# Patient Record
Sex: Female | Born: 1955 | Race: White | Hispanic: No | Marital: Married | Smoking: Former smoker
Health system: Southern US, Community
[De-identification: ages and names within clinical notes are randomized; demographics above are authoritative.]

## PROBLEM LIST (undated history)

## (undated) DIAGNOSIS — Z8489 Family history of other specified conditions: Secondary | ICD-10-CM

## (undated) DIAGNOSIS — F419 Anxiety disorder, unspecified: Secondary | ICD-10-CM

## (undated) DIAGNOSIS — K219 Gastro-esophageal reflux disease without esophagitis: Secondary | ICD-10-CM

## (undated) DIAGNOSIS — M1990 Unspecified osteoarthritis, unspecified site: Secondary | ICD-10-CM

## (undated) HISTORY — PX: BREAST BIOPSY: SHX20

## (undated) HISTORY — PX: BREAST SURGERY: SHX581

## (undated) HISTORY — PX: JOINT REPLACEMENT: SHX530

---

## 1994-05-11 HISTORY — PX: CYST EXCISION: SHX5701

## 2000-05-11 HISTORY — PX: PLANTAR FASCIA SURGERY: SHX746

## 2004-05-26 ENCOUNTER — Ambulatory Visit: Payer: Self-pay | Admitting: Unknown Physician Specialty

## 2005-08-31 ENCOUNTER — Ambulatory Visit: Payer: Self-pay | Admitting: Unknown Physician Specialty

## 2007-04-19 ENCOUNTER — Ambulatory Visit: Payer: Self-pay | Admitting: Unknown Physician Specialty

## 2008-11-05 ENCOUNTER — Ambulatory Visit: Payer: Self-pay | Admitting: Family Medicine

## 2009-04-23 ENCOUNTER — Ambulatory Visit: Payer: Self-pay | Admitting: Unknown Physician Specialty

## 2010-05-08 ENCOUNTER — Ambulatory Visit: Payer: Self-pay | Admitting: Unknown Physician Specialty

## 2011-01-23 ENCOUNTER — Ambulatory Visit: Payer: Self-pay | Admitting: Internal Medicine

## 2011-07-14 ENCOUNTER — Ambulatory Visit: Payer: Self-pay | Admitting: Unknown Physician Specialty

## 2015-05-01 ENCOUNTER — Ambulatory Visit
Admission: EM | Admit: 2015-05-01 | Discharge: 2015-05-01 | Disposition: A | Discharge status: Home / Self Care | Payer: BLUE CROSS/BLUE SHIELD | Attending: Family Medicine | Admitting: Family Medicine

## 2015-05-01 ENCOUNTER — Encounter: Payer: Self-pay | Admitting: *Deleted

## 2015-05-01 DIAGNOSIS — R350 Frequency of micturition: Secondary | ICD-10-CM | POA: Diagnosis not present

## 2015-05-01 DIAGNOSIS — R300 Dysuria: Secondary | ICD-10-CM

## 2015-05-01 DIAGNOSIS — J029 Acute pharyngitis, unspecified: Secondary | ICD-10-CM | POA: Diagnosis not present

## 2015-05-01 DIAGNOSIS — IMO0001 Reserved for inherently not codable concepts without codable children: Secondary | ICD-10-CM

## 2015-05-01 DIAGNOSIS — R109 Unspecified abdominal pain: Secondary | ICD-10-CM | POA: Diagnosis not present

## 2015-05-01 LAB — URINALYSIS COMPLETE WITH MICROSCOPIC (ARMC ONLY)
BACTERIA UA: NONE SEEN
Bilirubin Urine: NEGATIVE
Glucose, UA: NEGATIVE mg/dL
Hgb urine dipstick: NEGATIVE
Ketones, ur: NEGATIVE mg/dL
LEUKOCYTES UA: NEGATIVE
Nitrite: NEGATIVE
PH: 7 (ref 5.0–8.0)
PROTEIN: NEGATIVE mg/dL
RBC / HPF: NONE SEEN RBC/hpf (ref 0–5)
Specific Gravity, Urine: 1.015 (ref 1.005–1.030)
WBC, UA: NONE SEEN WBC/hpf (ref 0–5)

## 2015-05-01 LAB — RAPID STREP SCREEN (MED CTR MEBANE ONLY): Streptococcus, Group A Screen (Direct): NEGATIVE

## 2015-05-01 MED ORDER — FEXOFENADINE-PSEUDOEPHED ER 180-240 MG PO TB24: 1.0000 | ORAL_TABLET | Freq: Every day | ORAL | Status: DC

## 2015-05-01 MED ORDER — PHENAZOPYRIDINE HCL 200 MG PO TABS: 200.0000 mg | ORAL_TABLET | Freq: Three times a day (TID) | ORAL | Status: DC | PRN

## 2015-05-01 NOTE — ED Provider Notes (Signed)
CSN: [id number]     Arrival date & time 05/01/15  1138 History   First MD Initiated Contact with Patient 05/01/15 1327    Nurses notes were reviewed. Chief Complaint  Patient presents with  . Urinary Tract Infection  . Sore Throat   Patient is here with a mixed group of symptoms. She reports having some lower abdominal pain for about 3 weeks. She reports going to the bathroom more and having burning when she urinates. She states for the last 3-4 days she's also had a sore throat as well. Patient is somewhat vague as far as how intense the symptoms are. She denies any dysuria dyspareunia besides her usual dyspareunia and any vaginal discharge. When asked about fever she states she still gets warm and cold but no actual temperature was taken or known.  (Consider location/radiation/quality/duration/timing/severity/associated sxs/prior Treatment) Patient is a 59 y.o. female presenting with urinary tract infection and pharyngitis. The history is provided by the patient. No language interpreter was used.  Urinary Tract Infection Pain quality:  Burning Pain severity:  Mild Duration:  3 weeks Timing:  Intermittent Progression:  Waxing and waning Chronicity:  New Relieved by:  Nothing Ineffective treatments:  None tried Urinary symptoms: frequent urination   Urinary symptoms: no hesitancy and no bladder incontinence   Associated symptoms: abdominal pain   Associated symptoms: no fever, no flank pain, no nausea, no vaginal discharge and no vomiting   Risk factors: sexually active   Risk factors: no hx of pyelonephritis, no recurrent urinary tract infections and no sexually transmitted infections   Sore Throat This is a new problem. The current episode started more than 2 days ago. The problem occurs constantly. The problem has not changed since onset.Associated symptoms include abdominal pain. Pertinent negatives include no chest pain and no shortness of breath. Nothing aggravates the symptoms.  Nothing relieves the symptoms. She has tried nothing for the symptoms.    History reviewed. No pertinent past medical history. Past Surgical History  Procedure Laterality Date  . Breast surgery      right  . Cyst excision Left     arm  . Plantar fascia surgery Left    History reviewed. No pertinent family history. Social History  Substance Use Topics  . Smoking status: Never Smoker   . Smokeless tobacco: Never Used  . Alcohol Use: No   OB History    No data available     Review of Systems  Constitutional: Negative for fever.  Respiratory: Negative for shortness of breath.   Cardiovascular: Negative for chest pain.  Gastrointestinal: Positive for abdominal pain. Negative for nausea and vomiting.  Genitourinary: Negative for flank pain and vaginal discharge.  All other systems reviewed and are negative.   Allergies  Penicillins and Sulfa antibiotics  Home Medications   Prior to Admission medications   Not on File   Meds Ordered and Administered this Visit  Medications - No data to display  BP 142/79 mmHg  Pulse 87  Temp(Src) 97.8 F (36.6 C) (Oral)  Resp 18  Ht  (1.575 m)  Wt 150 lb (68.04 kg)  BMI 27.43 kg/m2  SpO2 97% No data found.   Physical Exam  Constitutional: She is oriented to person, place, and time. She appears well-developed.  HENT:  Head: Normocephalic and atraumatic.  Right Ear: External ear normal.  Left Ear: External ear normal.  Nose: Nose normal.  Mouth/Throat: Oropharynx is clear and moist.  Eyes: Pupils are equal, round, and reactive to  light.  Neck: Neck supple. No tracheal deviation present.  Abdominal: Bowel sounds are normal. There is no tenderness.  Musculoskeletal: Normal range of motion. She exhibits no tenderness.  Neurological: She is alert and oriented to person, place, and time. No cranial nerve deficit.  Skin: Skin is warm. No rash noted. No erythema.  Psychiatric: She has a normal mood and affect.  Vitals  reviewed.   ED Course  Procedures (including critical care time)  Labs Review Labs Reviewed  URINALYSIS COMPLETEWITH MICROSCOPIC ([facility] ONLY) - Abnormal; Notable for the following:    Color, Urine YELLOW (*)    APPearance CLEAR (*)    Squamous Epithelial / LPF 0-5 (*)    All other components within normal limits  RAPID STREP SCREEN (NOT [facility])  URINE CULTURE    Imaging Review No results found.   Visual Acuity Review  Right Eye Distance:   Left Eye Distance:   Bilateral Distance:    Right Eye Near:   Left Eye Near:    Bilateral Near:      Results for orders placed or performed during the hospital encounter of 05/01/15  Rapid strep screen  Result Value Ref Range   Streptococcus, Group A Screen (Direct) NEGATIVE NEGATIVE  Urinalysis complete, with microscopic  Result Value Ref Range   Color, Urine YELLOW (A) YELLOW   APPearance CLEAR (A) CLEAR   Glucose, UA NEGATIVE NEGATIVE mg/dL   Bilirubin Urine NEGATIVE NEGATIVE   Ketones, ur NEGATIVE NEGATIVE mg/dL   Specific Gravity, Urine 1.015 1.005 - 1.030   Hgb urine dipstick NEGATIVE NEGATIVE   pH 7.0 5.0 - 8.0   Protein, ur NEGATIVE NEGATIVE mg/dL   Nitrite NEGATIVE NEGATIVE   Leukocytes, UA NEGATIVE NEGATIVE   RBC / HPF NONE SEEN 0 - 5 RBC/hpf   WBC, UA NONE SEEN 0 - 5 WBC/hpf   Bacteria, UA NONE SEEN NONE SEEN   Squamous Epithelial / LPF 0-5 (A) NONE SEEN     MDM   1. Abdominal discomfort   2. Dysuria   3. Frequency   4. Pharyngitis     Were going to lay some Pyridium 1 tablet 3 times a day. Allegra-D daily. Will wait and see what the urine culture and strep test culture show before placing on an antibiotic. Work note given for today and tomorrow. Will treat if strep culture or urine culture comes back abnormal.       Hassan RowanEugene Chistina Roston, MD 05/01/15 1416

## 2015-05-01 NOTE — ED Notes (Signed)
Patient has been having symptoms lower abdominal pain and burning during urination for one month. Additional symptom of sore throat started 2 days ago. Nasal congestion that is grayish in color. Patient states that she feels feverish.

## 2015-05-01 NOTE — Discharge Instructions (Signed)
Abdominal Pain, Adult Many things can cause belly (abdominal) pain. Most times, the belly pain is not dangerous. Many cases of belly pain can be watched and treated at home. HOME CARE   Do not take medicines that help you go poop (laxatives) unless told to by your doctor.  Only take medicine as told by your doctor.  Eat or drink as told by your doctor. Your doctor will tell you if you should be on a special diet. GET HELP IF:  You do not know what is causing your belly pain.  You have belly pain while you are sick to your stomach (nauseous) or have runny poop (diarrhea).  You have pain while you pee or poop.  Your belly pain wakes you up at night.  You have belly pain that gets worse or better when you eat.  You have belly pain that gets worse when you eat fatty foods.  You have a fever. GET HELP RIGHT AWAY IF:   The pain does not go away within 2 hours.  You keep throwing up (vomiting).  The pain changes and is only in the right or left part of the belly.  You have bloody or tarry looking poop. MAKE SURE YOU:   Understand these instructions.  Will watch your condition.  Will get help right away if you are not doing well or get worse.   This information is not intended to replace advice given to you by your health care provider. Make sure you discuss any questions you have with your health care provider.   Document Released: 10/14/2007 Document Revised: 05/18/2014 Document Reviewed: 01/04/2013 Elsevier Interactive Patient Education 2016 [employer]  Pharyngitis Pharyngitis is a sore throat (pharynx). There is redness, pain, and swelling of your throat. HOME CARE   Drink enough fluids to keep your pee (urine) clear or pale yellow.  Only take medicine as told by your doctor.  You may get sick again if you do not take medicine as told. Finish your medicines, even if you start to feel better.  Do not take aspirin.  Rest.  Rinse your mouth (gargle) with salt  water ( tsp of salt per 1 qt of water) every 1-2 hours. This will help the pain.  If you are not at risk for choking, you can suck on hard candy or sore throat lozenges. GET HELP IF:  You have large, tender lumps on your neck.  You have a rash.  You cough up green, yellow-brown, or bloody spit. GET HELP RIGHT AWAY IF:   You have a stiff neck.  You drool or cannot swallow liquids.  You throw up (vomit) or are not able to keep medicine or liquids down.  You have very bad pain that does not go away with medicine.  You have problems breathing (not from a stuffy nose). MAKE SURE YOU:   Understand these instructions.  Will watch your condition.  Will get help right away if you are not doing well or get worse.   This information is not intended to replace advice given to you by your health care provider. Make sure you discuss any questions you have with your health care provider.   Document Released: 10/14/2007 Document Revised: 02/15/2013 Document Reviewed: 01/02/2013 Elsevier Interactive Patient Education 2016 [employer].  Urinary Frequency The number of times a normal person urinates depends upon how much liquid they take in and how much liquid they are losing. If the temperature is hot and there is high humidity, then  the person will sweat more and usually breathe a little more frequently. These factors decrease the amount of frequency of urination that would be considered normal. The amount you drink is easily determined, but the amount of fluid lost is sometimes more difficult to calculate.  Fluid is lost in two ways:  Sensible fluid loss is usually measured by the amount of urine that you get rid of. Losses of fluid can also occur with diarrhea.  Insensible fluid loss is more difficult to measure. It is caused by evaporation. Insensible loss of fluid occurs through breathing and sweating. It usually ranges from a little less than a quart to a little more than a quart of  fluid a day. In normal temperatures and activity levels, the average person may urinate 4 to 7 times in a 24-hour period. Needing to urinate more often than that could indicate a problem. If one urinates 4 to 7 times in 24 hours and has large volumes each time, that could indicate a different problem from one who urinates 4 to 7 times a day and has small volumes. The time of urinating is also important. Most urinating should be done during the waking hours. Getting up at night to urinate frequently can indicate some problems. CAUSES  The bladder is the organ in your lower abdomen that holds urine. Like a balloon, it swells some as it fills up. Your nerves sense this and tell you it is time to head for the bathroom. There are a number of reasons that you might feel the need to urinate more often than usual. They include:  Urinary tract infection. This is usually associated with other signs such as burning when you urinate.  In men, problems with the prostate (a walnut-size gland that is located near the tube that carries urine out of your body). There are two reasons why the prostate can cause an increased frequency of urination:  An enlarged prostate that does not let the bladder empty well. If the bladder only half empties when you urinate, then it only has half the capacity to fill before you have to urinate again.  The nerves in the bladder become more hypersensitive with an increased size of the prostate even if the bladder empties completely.  Pregnancy.  Obesity. Excess weight is more likely to cause a problem for women than for men.  Bladder stones or other bladder problems.  Caffeine.  Alcohol.  Medications. For example, drugs that help the body get rid of extra fluid (diuretics) increase urine production. Some other medicines must be taken with lots of fluids.  Muscle or nerve weakness. This might be the result of a spinal cord injury, a stroke, multiple sclerosis, or Parkinson  disease.  Long-standing diabetes can decrease the sensation of the bladder. This loss of sensation makes it harder to sense the bladder needs to be emptied. Over a period of years, the bladder is stretched out by constant overfilling. This weakens the bladder muscles so that the bladder does not empty well and has less capacity to fill with new urine.  Interstitial cystitis (also called painful bladder syndrome). This condition develops because the tissues that line the inside of the bladder are inflamed (inflammation is the body's way of reacting to injury or infection). It causes pain and frequent urination. It occurs in women more often than in men. DIAGNOSIS   To decide what might be causing your urinary frequency, your health care provider will probably:  Ask about symptoms you have noticed.  Ask about your overall health. This will include questions about any medications you are taking.  Do a physical examination.  Order some tests. These might include:  A blood test to check for diabetes or other health issues that could be contributing to the problem.  Urine testing. This could measure the flow of urine and the pressure on the bladder.  A test of your neurological system (the brain, spinal cord, and nerves). This is the system that senses the need to urinate.  A bladder test to check whether it is emptying completely when you urinate.  Cystoscopy. This test uses a thin tube with a tiny camera on it. It offers a look inside your urethra and bladder to see if there are problems.  Imaging tests. You might be given a contrast dye and then asked to urinate. X-rays are taken to see how your bladder is working. TREATMENT  It is important for you to be evaluated to determine if the amount or frequency that you have is unusual or abnormal. If it is found to be abnormal, the cause should be determined and this can usually be found out easily. Depending upon the cause, treatment could  include medication, stimulation of the nerves, or surgery. There are not too many things that you can do as an individual to change your urinary frequency. It is important that you balance the amount of fluid intake needed to compensate for your activity and the temperature. Medical problems will be diagnosed and taken care of by your physician. There is no particular bladder training such as Kegel exercises that you can do to help urinary frequency. This is an exercise that is usually recommended for people who have leaking of urine when they laugh, cough, or sneeze. HOME CARE INSTRUCTIONS   Take any medications your health care provider prescribed or suggested. Follow the directions carefully.  Practice any lifestyle changes that are recommended. These might include:  Drinking less fluid or drinking at different times of the day. If you need to urinate often during the night, for example, you may need to stop drinking fluids early in the evening.  Cutting down on caffeine or alcohol. They both can make you need to urinate more often than normal. Caffeine is found in coffee, tea, and sodas.  Losing weight, if that is recommended.  Keep a journal or a log. You might be asked to record how much you drink and when and where you feel the need to urinate. This will also help evaluate how well the treatment provided by your physician is working. SEEK MEDICAL CARE IF:   Your need to urinate often gets worse.  You feel increased pain or irritation when you urinate.  You notice blood in your urine.  You have questions about any medications that your health care provider recommended.  You notice blood, pus, or swelling at the site of any test or treatment procedure.  You develop a fever of more than 100.43F (38.1C). SEEK IMMEDIATE MEDICAL CARE IF:  You develop a fever of more than 102.58F (38.9C).   This information is not intended to replace advice given to you by your health care provider.  Make sure you discuss any questions you have with your health care provider.   Document Released: 02/21/2009 Document Revised: 05/18/2014 Document Reviewed: 02/21/2009 Elsevier Interactive Patient Education 2016 [employer] Inc.  Dysuria Dysuria is pain or discomfort while urinating. The pain or discomfort may be felt in the tube that carries urine out of the  bladder (urethra) or in the surrounding tissue of the genitals. The pain may also be felt in the groin area, lower abdomen, and lower back. You may have to urinate frequently or have the sudden feeling that you have to urinate (urgency). Dysuria can affect both men and women, but is more common in women. Dysuria can be caused by many different things, including:  Urinary tract infection in women.  Infection of the kidney or bladder.  Kidney stones or bladder stones.  Certain sexually transmitted infections (STIs), such as chlamydia.  Dehydration.  Inflammation of the vagina.  Use of certain medicines.  Use of certain soaps or scented products that cause irritation. HOME CARE INSTRUCTIONS Watch your dysuria for any changes. The following actions may help to reduce any discomfort you are feeling:  Drink enough fluid to keep your urine clear or pale yellow.  Empty your bladder often. Avoid holding urine for long periods of time.  After a bowel movement or urination, women should cleanse from front to back, using each tissue only once.  Empty your bladder after sexual intercourse.  Take medicines only as directed by your health care provider.  If you were prescribed an antibiotic medicine, finish it all even if you start to feel better.  Avoid caffeine, tea, and alcohol. They can irritate the bladder and make dysuria worse. In men, alcohol may irritate the prostate.  Keep all follow-up visits as directed by your health care provider. This is important.  If you had any tests done to find the cause of dysuria, it is your  responsibility to obtain your test results. Ask the lab or department performing the test when and how you will get your results. Talk with your health care provider if you have any questions about your results. SEEK MEDICAL CARE IF:  You develop pain in your back or sides.  You have a fever.  You have nausea or vomiting.  You have blood in your urine.  You are not urinating as often as you usually do. SEEK IMMEDIATE MEDICAL CARE IF:  You pain is severe and not relieved with medicines.  You are unable to hold down any fluids.  You or someone else notices a change in your mental function.  You have a rapid heartbeat at rest.  You have shaking or chills.  You feel extremely weak.   This information is not intended to replace advice given to you by your health care provider. Make sure you discuss any questions you have with your health care provider.   Document Released: 01/24/2004 Document Revised: 05/18/2014 Document Reviewed: 12/21/2013 Elsevier Interactive Patient Education [employer]

## 2015-05-03 LAB — URINE CULTURE: Special Requests: NORMAL

## 2015-05-03 LAB — CULTURE, GROUP A STREP (THRC)

## 2015-06-05 ENCOUNTER — Other Ambulatory Visit: Payer: Self-pay | Admitting: Nurse Practitioner

## 2015-06-05 DIAGNOSIS — Z87828 Personal history of other (healed) physical injury and trauma: Secondary | ICD-10-CM

## 2015-06-12 ENCOUNTER — Ambulatory Visit
Admission: RE | Admit: 2015-06-12 | Discharge: 2015-06-12 | Disposition: A | Discharge status: Home / Self Care | Payer: BLUE CROSS/BLUE SHIELD | Source: Ambulatory Visit | Attending: Nurse Practitioner | Admitting: Nurse Practitioner

## 2015-06-12 DIAGNOSIS — R51 Headache: Secondary | ICD-10-CM | POA: Diagnosis not present

## 2015-06-12 DIAGNOSIS — H5711 Ocular pain, right eye: Secondary | ICD-10-CM | POA: Insufficient documentation

## 2015-06-12 DIAGNOSIS — Z87828 Personal history of other (healed) physical injury and trauma: Secondary | ICD-10-CM

## 2015-12-23 ENCOUNTER — Other Ambulatory Visit: Payer: Self-pay | Admitting: Nurse Practitioner

## 2015-12-23 DIAGNOSIS — Z1231 Encounter for screening mammogram for malignant neoplasm of breast: Secondary | ICD-10-CM

## 2015-12-31 ENCOUNTER — Ambulatory Visit
Admission: RE | Admit: 2015-12-31 | Discharge: 2015-12-31 | Disposition: A | Discharge status: Home / Self Care | Payer: BLUE CROSS/BLUE SHIELD | Source: Ambulatory Visit | Attending: Nurse Practitioner | Admitting: Nurse Practitioner

## 2015-12-31 ENCOUNTER — Other Ambulatory Visit: Payer: Self-pay | Admitting: Nurse Practitioner

## 2015-12-31 DIAGNOSIS — Z1231 Encounter for screening mammogram for malignant neoplasm of breast: Secondary | ICD-10-CM

## 2016-09-23 ENCOUNTER — Encounter: Payer: Self-pay | Admitting: *Deleted

## 2016-09-23 ENCOUNTER — Ambulatory Visit
Admission: EM | Admit: 2016-09-23 | Discharge: 2016-09-23 | Disposition: A | Discharge status: Home / Self Care | Payer: BLUE CROSS/BLUE SHIELD | Attending: Family Medicine | Admitting: Family Medicine

## 2016-09-23 DIAGNOSIS — J309 Allergic rhinitis, unspecified: Secondary | ICD-10-CM | POA: Diagnosis not present

## 2016-09-23 DIAGNOSIS — H6692 Otitis media, unspecified, left ear: Secondary | ICD-10-CM | POA: Diagnosis not present

## 2016-09-23 DIAGNOSIS — J029 Acute pharyngitis, unspecified: Secondary | ICD-10-CM

## 2016-09-23 LAB — RAPID STREP SCREEN (MED CTR MEBANE ONLY): STREPTOCOCCUS, GROUP A SCREEN (DIRECT): NEGATIVE

## 2016-09-23 MED ORDER — AZITHROMYCIN 250 MG PO TABS: ORAL_TABLET | ORAL | 0 refills | Status: DC

## 2016-09-23 NOTE — ED Provider Notes (Signed)
[facility] ____________________________________________  Time seen: Approximately 5:34 PM  I have reviewed the triage vital signs and the nursing notes.  HISTORY  Chief Complaint Sore Throat; Nasal Congestion; and Otalgia  HPI Jillian Nelson is a 61 y.o. female presenting for evaluation of 3-4 days of runny nose, nasal congestion, ear discomfort and sore throat. States currently the complaint of sore throat and left ear pain. Reports continues to eat and drink well. Denies known fevers. Denies known sick contacts. Reports does have some seasonal allergy issues. Denies chest pain, shortness of breath, abdominal pain, dysuria, or rash. Denies recent sickness. Denies recent antibiotic use. Reports symptoms unresolved with over-the-counter cough and congestion medications.  No LMP recorded. Patient is postmenopausal.  Center, [facility]: PCP   History reviewed. No pertinent past medical history.  There are no active problems to display for this patient.   Past Surgical History:  Procedure Laterality Date  . BREAST BIOPSY Right    neg  . BREAST SURGERY     right  . CYST EXCISION Left    arm  . PLANTAR FASCIA SURGERY Left      No current facility-administered medications for this encounter.   Current Outpatient Prescriptions:  .  azithromycin (ZITHROMAX Z-PAK) 250 MG tablet, Take 2 tablets (500 mg) on  Day 1,  followed by 1 tablet (250 mg) once daily on Days 2 through 5., Disp: 6 each, Rfl: 0 .  fexofenadine-pseudoephedrine (ALLEGRA-D ALLERGY & CONGESTION) 180-240 MG 24 hr tablet, Take 1 tablet by mouth daily., Disp: 30 tablet, Rfl: 0 .  phenazopyridine (PYRIDIUM) 200 MG tablet, Take 1 tablet (200 mg total) by mouth 3 (three) times daily as needed for pain., Disp: 15 tablet, Rfl: 0  Allergies Penicillins and Sulfa antibiotics  Family History  Problem Relation Age of Onset  . Dementia Father   . Breast cancer Neg Hx     Social History Social  History  Substance Use Topics  . Smoking status: Never Smoker  . Smokeless tobacco: Never Used  . Alcohol use No    Review of Systems Constitutional: No fever/chills Eyes: No visual changes. ENT: Positive sore throat. Cardiovascular: Denies chest pain. Respiratory: Denies shortness of breath. Gastrointestinal: No abdominal pain.  No nausea, no vomiting.  No diarrhea.  No constipation. Genitourinary: Negative for dysuria. Musculoskeletal: Negative for back pain. Skin: Negative for rash.   ____________________________________________   PHYSICAL EXAM:  VITAL SIGNS: ED Triage Vitals  Enc Vitals Group     BP 09/23/16 1652 (!) 148/79     Pulse Rate 09/23/16 1652 92     Resp 09/23/16 1652 16     Temp 09/23/16 1652 98.4 F (36.9 C)     Temp Source 09/23/16 1652 Oral     SpO2 09/23/16 1652 96 %     Weight 09/23/16 1652 160 lb (72.6 kg)     Height 09/23/16 1652 5\' 2"  (1.575 m)     Head Circumference --      Peak Flow --      Pain Score 09/23/16 1654 5     Pain Loc --      Pain Edu? --      Excl. in GC? --     Constitutional: Alert and oriented. Well appearing and in no acute distress. Eyes: Conjunctivae are normal. PERRL. EOMI. Head: Atraumatic. No sinus tenderness to palpation. No swelling. No erythema.  Ears: Left: Mild tenderness to auricle movement, moderate erythema and bulging TM, small effusion also noted,  no drainage. Right: Nontender, no erythema, normal TMs, no drainage. No surrounding tenderness, swelling or erythema bilaterally.  Nose:Nasal congestion with clear rhinorrhea  Mouth/Throat: Mucous membranes are moist. Mild pharyngeal erythema. No tonsillar swelling or exudate.  Neck: No stridor.  No cervical spine tenderness to palpation. Hematological/Lymphatic/Immunilogical: No cervical lymphadenopathy. Cardiovascular: Normal rate, regular rhythm. Grossly normal heart sounds.  Good peripheral circulation. Respiratory: Normal respiratory effort.  No retractions.  No wheezes, rales or rhonchi. Good air movement.  Musculoskeletal: Ambulatory with steady gait.  Neurologic:  Normal speech and language. No gait instability. Skin:  Skin appears warm, dry Psychiatric: Mood and affect are normal. Speech and behavior are normal. ___________________________________________   LABS (all labs ordered are listed, but only abnormal results are displayed)  Labs Reviewed  RAPID STREP SCREEN (NOT AT [facility])  CULTURE, GROUP A STREP [facility])     PROCEDURES Procedures    INITIAL IMPRESSION / ASSESSMENT AND PLAN / ED COURSE  Pertinent labs & imaging results that were available during my care of the patient were reviewed by me and considered in my medical decision making (see chart for details).  Well-appearing patient. No acute distress. Left otitis media. Quick strep negative, will culture. Suspect allergic rhinitis versus viral upper respiratory infection. Discussed with patient to continue home nightly Claritin and will treat patient with oral oral azithromycin. Patient with penicillin and sulfa allergy with anaphylaxis. Encourage rest, fluids and supportive care.Discussed indication, risks and benefits of medications with patient.  Discussed follow up with Primary care physician this week. Discussed follow up and return parameters including no resolution or any worsening concerns. Patient verbalized understanding and agreed to plan.   ____________________________________________   FINAL CLINICAL IMPRESSION(S) / ED DIAGNOSES  Final diagnoses:  Left otitis media, unspecified otitis media type  Allergic rhinitis, unspecified seasonality, unspecified trigger  Pharyngitis, unspecified etiology     Discharge Medication List as of 09/23/2016  5:58 PM    START taking these medications   Details  azithromycin (ZITHROMAX Z-PAK) 250 MG tablet Take 2 tablets (500 mg) on  Day 1,  followed by 1 tablet (250 mg) once daily on Days 2 through 5., Normal        Note:  This dictation was prepared with Dragon dictation along with smaller phrase technology. Any transcriptional errors that result from this process are unintentional.         Renford DillsMiller, Dequavius Kuhner, NP 09/23/16 [phone]

## 2016-09-23 NOTE — ED Triage Notes (Signed)
PAtient started having symptoms of sore throat, nasal congestion, cough, and left ear pain 3 days ago.

## 2016-09-23 NOTE — Discharge Instructions (Signed)
Take medication as prescribed. Rest. Drink plenty of fluids. Take home Claritin.   Follow up with your primary care physician this week as needed. Return to Urgent care for new or worsening concerns.

## 2016-09-26 LAB — CULTURE, GROUP A STREP (THRC)

## 2016-12-09 ENCOUNTER — Encounter
Admission: RE | Admit: 2016-12-09 | Discharge: 2016-12-09 | Disposition: A | Discharge status: Home / Self Care | Payer: BLUE CROSS/BLUE SHIELD | Source: Ambulatory Visit | Attending: Orthopedic Surgery | Admitting: Orthopedic Surgery

## 2016-12-09 DIAGNOSIS — Z01812 Encounter for preprocedural laboratory examination: Secondary | ICD-10-CM | POA: Diagnosis present

## 2016-12-09 HISTORY — DX: Gastro-esophageal reflux disease without esophagitis: K21.9

## 2016-12-09 HISTORY — DX: Family history of other specified conditions: Z84.89

## 2016-12-09 HISTORY — DX: Anxiety disorder, unspecified: F41.9

## 2016-12-09 HISTORY — DX: Unspecified osteoarthritis, unspecified site: M19.90

## 2016-12-09 LAB — COMPREHENSIVE METABOLIC PANEL
ALT: 37 U/L (ref 14–54)
AST: 27 U/L (ref 15–41)
Albumin: 4.3 g/dL (ref 3.5–5.0)
Alkaline Phosphatase: 58 U/L (ref 38–126)
Anion gap: 8 (ref 5–15)
BUN: 15 mg/dL (ref 6–20)
CO2: 26 mmol/L (ref 22–32)
Calcium: 9.6 mg/dL (ref 8.9–10.3)
Chloride: 107 mmol/L (ref 101–111)
Creatinine, Ser: 0.56 mg/dL (ref 0.44–1.00)
GFR calc Af Amer: >60 mL/min (ref >60)
GFR calc non Af Amer: >60 mL/min (ref >60)
Glucose, Bld: 92 mg/dL (ref 65–99)
Potassium: 3.9 mmol/L (ref 3.5–5.1)
Sodium: 141 mmol/L (ref 135–145)
Total Bilirubin: 0.6 mg/dL (ref 0.3–1.2)
Total Protein: 7.3 g/dL (ref 6.5–8.1)

## 2016-12-09 LAB — URINALYSIS, ROUTINE W REFLEX MICROSCOPIC
BILIRUBIN URINE: NEGATIVE
GLUCOSE, UA: NEGATIVE mg/dL
Hgb urine dipstick: NEGATIVE
KETONES UR: NEGATIVE mg/dL
Leukocytes, UA: NEGATIVE
Nitrite: NEGATIVE
PH: 6 (ref 5.0–8.0)
Protein, ur: NEGATIVE mg/dL
Specific Gravity, Urine: 1.013 (ref 1.005–1.030)

## 2016-12-09 LAB — CBC
HCT: 40.8 % (ref 35.0–47.0)
Hemoglobin: 14 g/dL (ref 12.0–16.0)
MCH: 32 pg (ref 26.0–34.0)
MCHC: 34.2 g/dL (ref 32.0–36.0)
MCV: 93.6 fL (ref 80.0–100.0)
Platelets: 288 10*3/uL (ref 150–440)
RBC: 4.36 MIL/uL (ref 3.80–5.20)
RDW: 13.1 % (ref 11.5–14.5)
WBC: 5.7 10*3/uL (ref 3.6–11.0)

## 2016-12-09 LAB — TYPE AND SCREEN
ABO/RH(D): A POS
Antibody Screen: NEGATIVE

## 2016-12-09 LAB — SURGICAL PCR SCREEN
MRSA, PCR: NEGATIVE
Staphylococcus aureus: NEGATIVE

## 2016-12-09 LAB — PROTIME-INR
INR: 0.91
PROTHROMBIN TIME: 12.2 s (ref 11.4–15.2)

## 2016-12-09 LAB — APTT: aPTT: 31 seconds (ref 24–36)

## 2016-12-09 LAB — C-REACTIVE PROTEIN: CRP: <0.8 mg/dL (ref <1.0)

## 2016-12-09 LAB — SEDIMENTATION RATE: SED RATE: 30 mm/h (ref 0–30)

## 2016-12-09 NOTE — Pre-Procedure Instructions (Signed)
Pt has a red rash on both lower legs.

## 2016-12-09 NOTE — Pre-Procedure Instructions (Signed)
Spoke with Consuella LoseElaine at Dr. Elenor LegatoHooten's office, pt will be stoping by to have the rash on her legs looked at.

## 2016-12-09 NOTE — Patient Instructions (Signed)
  Your procedure is scheduled on: Monday December 21, 2016. Report to Same Day Surgery. To find out your arrival time please call [phone] between 1PM - 3PM on Friday December 18, 2016.  Remember: Instructions that are not followed completely may result in serious medical risk, up to and including death, or upon the discretion of your surgeon and anesthesiologist your surgery may need to be rescheduled.    _x___ 1. Do not eat food or drink liquids after midnight. No gum chewing or hard candies.     ____ 2. No Alcohol for 24 hours before or after surgery.   ____ 3. Bring all medications with you on the day of surgery if instructed.    __x__ 4. Notify your doctor if there is any change in your medical condition     (cold, fever, infections).    _____ 5. No smoking 24 hours prior to surgery.     Do not wear jewelry, make-up, hairpins, clips or nail polish.  Do not wear lotions, powders, or perfumes.   Do not shave 48 hours prior to surgery. Men may shave face and neck.  Do not bring valuables to the hospital.    [facility] for any belongings or valuables.               Contacts, dentures or bridgework may not be worn into surgery.  Leave your suitcase in the car. After surgery it may be brought to your room.  For patients admitted to the hospital, discharge time is determined by your treatment team.   Patients discharged the day of surgery will not be allowed to drive home.    Please read over the following fact sheets that you were given:   [facility] Preparing for [facility]  _x___ Take these medicines the morning of surgery with A SIP OF WATER:      ____ Fleet Enema (as directed)   _x__ Use CHG Soap as directed on instruction sheet  ____ Use inhalers on the day of surgery and bring to hospital day of surgery  ____ Stop metformin 2 days prior to surgery    ____ Take 1/2 of usual insulin dose the night before surgery and none on the morning of surgery.    ____ Stop aspirin 7 days prior to surgery.  __x__ Stop Anti-inflammatories such as Advil, Aleve, Ibuprofen, Motrin, Naproxen, Naprosyn, Goodies powders or aspirin  products. OK to take Tylenol.   __x__ Stop supplements:Omega-3 Fatty Acids (FISH OIL PO) & vitamin E   until after surgery.    ____ Bring C-Pap to the hospital.

## 2016-12-10 LAB — LATEX, IGE

## 2016-12-10 LAB — URINE CULTURE
CULTURE: NO GROWTH
Special Requests: NORMAL

## 2016-12-20 MED ORDER — CLINDAMYCIN PHOSPHATE 900 MG/50ML IV SOLN: 900.0000 mg | INTRAVENOUS | Status: DC

## 2016-12-20 MED ORDER — TRANEXAMIC ACID 1000 MG/10ML IV SOLN
1000.0000 mg | INTRAVENOUS | Status: DC
  Filled 2016-12-20: qty 10

## 2016-12-21 ENCOUNTER — Inpatient Hospital Stay: Payer: BLUE CROSS/BLUE SHIELD

## 2016-12-21 ENCOUNTER — Inpatient Hospital Stay
Admission: RE | Admit: 2016-12-21 | Discharge: 2016-12-23 | DRG: 470 | Disposition: A | Discharge status: Home Health | Payer: BLUE CROSS/BLUE SHIELD | Source: Ambulatory Visit | Attending: Orthopedic Surgery | Admitting: Orthopedic Surgery

## 2016-12-21 ENCOUNTER — Inpatient Hospital Stay: Payer: BLUE CROSS/BLUE SHIELD | Admitting: Anesthesiology

## 2016-12-21 ENCOUNTER — Encounter: Payer: Self-pay | Admitting: Orthopedic Surgery

## 2016-12-21 ENCOUNTER — Encounter
Admission: RE | Disposition: A | Discharge status: Home Health | Payer: Self-pay | Source: Ambulatory Visit | Attending: Orthopedic Surgery

## 2016-12-21 DIAGNOSIS — K219 Gastro-esophageal reflux disease without esophagitis: Secondary | ICD-10-CM | POA: Diagnosis present

## 2016-12-21 DIAGNOSIS — F419 Anxiety disorder, unspecified: Secondary | ICD-10-CM | POA: Diagnosis present

## 2016-12-21 DIAGNOSIS — M1711 Unilateral primary osteoarthritis, right knee: Principal | ICD-10-CM | POA: Diagnosis present

## 2016-12-21 DIAGNOSIS — Z96651 Presence of right artificial knee joint: Secondary | ICD-10-CM

## 2016-12-21 DIAGNOSIS — Z96659 Presence of unspecified artificial knee joint: Secondary | ICD-10-CM

## 2016-12-21 DIAGNOSIS — Z87891 Personal history of nicotine dependence: Secondary | ICD-10-CM | POA: Diagnosis not present

## 2016-12-21 HISTORY — PX: OTHER SURGICAL HISTORY: SHX169

## 2016-12-21 HISTORY — PX: KNEE ARTHROPLASTY: SHX992

## 2016-12-21 LAB — ABO/RH: ABO/RH(D): A POS

## 2016-12-21 SURGERY — ARTHROPLASTY, KNEE, TOTAL, USING IMAGELESS COMPUTER-ASSISTED NAVIGATION
Anesthesia: Spinal | Site: Knee | Laterality: Right | Wound class: Clean

## 2016-12-21 MED ORDER — PANTOPRAZOLE SODIUM 40 MG PO TBEC
40.0000 mg | DELAYED_RELEASE_TABLET | Freq: Two times a day (BID) | ORAL | Status: DC
  Administered 2016-12-21 – 2016-12-22 (×3): 40 mg via ORAL
  Filled 2016-12-21 (×3): qty 1

## 2016-12-21 MED ORDER — ONDANSETRON HCL 4 MG/2ML IJ SOLN: 4.0000 mg | Freq: Once | INTRAMUSCULAR | Status: DC | PRN

## 2016-12-21 MED ORDER — ENOXAPARIN SODIUM 30 MG/0.3ML ~~LOC~~ SOLN
30.0000 mg | Freq: Two times a day (BID) | SUBCUTANEOUS | Status: DC
  Administered 2016-12-22 – 2016-12-23 (×3): 30 mg via SUBCUTANEOUS
  Filled 2016-12-21 (×3): qty 0.3

## 2016-12-21 MED ORDER — PROPOFOL 500 MG/50ML IV EMUL
INTRAVENOUS | Status: DC | PRN
  Administered 2016-12-21: 70 ug/kg/min via INTRAVENOUS

## 2016-12-21 MED ORDER — BUPIVACAINE HCL (PF) 0.25 % IJ SOLN
INTRAMUSCULAR | Status: AC
  Filled 2016-12-21: qty 30

## 2016-12-21 MED ORDER — ADULT MULTIVITAMIN W/MINERALS CH
1.0000 | ORAL_TABLET | Freq: Every day | ORAL | Status: DC
  Administered 2016-12-22: 1 via ORAL
  Filled 2016-12-21: qty 1

## 2016-12-21 MED ORDER — MORPHINE SULFATE (PF) 4 MG/ML IV SOLN
2.0000 mg | INTRAVENOUS | Status: DC | PRN
  Administered 2016-12-21: 2 mg via INTRAVENOUS
  Filled 2016-12-21: qty 1

## 2016-12-21 MED ORDER — CLINDAMYCIN PHOSPHATE 600 MG/50ML IV SOLN
600.0000 mg | Freq: Four times a day (QID) | INTRAVENOUS | Status: AC
  Administered 2016-12-21 – 2016-12-22 (×4): 600 mg via INTRAVENOUS
  Filled 2016-12-21 (×5): qty 50

## 2016-12-21 MED ORDER — EPINEPHRINE PF 1 MG/ML IJ SOLN
INTRAMUSCULAR | Status: AC
Start: 2016-12-21 — End: 2016-12-21
  Filled 2016-12-21: qty 1

## 2016-12-21 MED ORDER — GLYCOPYRROLATE 0.2 MG/ML IJ SOLN
INTRAMUSCULAR | Status: AC
  Filled 2016-12-21: qty 1

## 2016-12-21 MED ORDER — TRANEXAMIC ACID 1000 MG/10ML IV SOLN
INTRAVENOUS | Status: DC | PRN
  Administered 2016-12-21: 1000 mg via INTRAVENOUS

## 2016-12-21 MED ORDER — MENTHOL 3 MG MT LOZG
1.0000 | LOZENGE | OROMUCOSAL | Status: DC | PRN
  Filled 2016-12-21: qty 9

## 2016-12-21 MED ORDER — NEOMYCIN-POLYMYXIN B GU 40-200000 IR SOLN
Status: DC | PRN
  Administered 2016-12-21: 2 mL

## 2016-12-21 MED ORDER — FENTANYL CITRATE (PF) 100 MCG/2ML IJ SOLN
INTRAMUSCULAR | Status: AC
  Administered 2016-12-21: 25 ug via INTRAVENOUS
  Filled 2016-12-21: qty 2

## 2016-12-21 MED ORDER — PROPOFOL 10 MG/ML IV BOLUS
INTRAVENOUS | Status: DC | PRN
  Administered 2016-12-21: 14 mg via INTRAVENOUS

## 2016-12-21 MED ORDER — ONDANSETRON HCL 4 MG/2ML IJ SOLN: 4.0000 mg | Freq: Four times a day (QID) | INTRAMUSCULAR | Status: DC | PRN

## 2016-12-21 MED ORDER — EPHEDRINE SULFATE 50 MG/ML IJ SOLN
INTRAMUSCULAR | Status: AC
  Filled 2016-12-21: qty 1

## 2016-12-21 MED ORDER — BUPIVACAINE LIPOSOME 1.3 % IJ SUSP
INTRAMUSCULAR | Status: AC
  Filled 2016-12-21: qty 20

## 2016-12-21 MED ORDER — TRAMADOL HCL 50 MG PO TABS
50.0000 mg | ORAL_TABLET | ORAL | Status: DC | PRN
  Administered 2016-12-21: 50 mg via ORAL
  Administered 2016-12-22: 100 mg via ORAL
  Administered 2016-12-23: 50 mg via ORAL
  Filled 2016-12-21: qty 2
  Filled 2016-12-21 (×2): qty 1

## 2016-12-21 MED ORDER — ONDANSETRON HCL 4 MG/2ML IJ SOLN
INTRAMUSCULAR | Status: DC | PRN
  Administered 2016-12-21: 4 mg via INTRAVENOUS

## 2016-12-21 MED ORDER — OXYCODONE HCL 5 MG PO TABS
5.0000 mg | ORAL_TABLET | ORAL | Status: DC | PRN
  Administered 2016-12-21: 5 mg via ORAL
  Administered 2016-12-22 – 2016-12-23 (×7): 10 mg via ORAL
  Filled 2016-12-21: qty 1
  Filled 2016-12-21 (×8): qty 2

## 2016-12-21 MED ORDER — BUPIVACAINE HCL (PF) 0.5 % IJ SOLN
INTRAMUSCULAR | Status: DC | PRN
  Administered 2016-12-21: 2.8 mL

## 2016-12-21 MED ORDER — TRANEXAMIC ACID 1000 MG/10ML IV SOLN: INTRAVENOUS | Status: DC | PRN

## 2016-12-21 MED ORDER — MIDAZOLAM HCL 2 MG/2ML IJ SOLN
INTRAMUSCULAR | Status: AC
  Filled 2016-12-21: qty 2

## 2016-12-21 MED ORDER — ALUM & MAG HYDROXIDE-SIMETH 200-200-20 MG/5ML PO SUSP: 30.0000 mL | ORAL | Status: DC | PRN

## 2016-12-21 MED ORDER — CLINDAMYCIN PHOSPHATE 900 MG/50ML IV SOLN
INTRAVENOUS | Status: AC
  Filled 2016-12-21: qty 50

## 2016-12-21 MED ORDER — NEOMYCIN-POLYMYXIN B GU 40-200000 IR SOLN
Status: AC
  Filled 2016-12-21: qty 1

## 2016-12-21 MED ORDER — PHENOL 1.4 % MT LIQD
1.0000 | OROMUCOSAL | Status: DC | PRN
  Filled 2016-12-21: qty 177

## 2016-12-21 MED ORDER — SODIUM CHLORIDE 0.9 % IV SOLN
INTRAVENOUS | Status: DC | PRN
  Administered 2016-12-21: 60 mL

## 2016-12-21 MED ORDER — CHLORHEXIDINE GLUCONATE 4 % EX LIQD: 60.0000 mL | Freq: Once | CUTANEOUS | Status: DC

## 2016-12-21 MED ORDER — ACETAMINOPHEN 10 MG/ML IV SOLN
1000.0000 mg | Freq: Four times a day (QID) | INTRAVENOUS | Status: AC
  Administered 2016-12-21 – 2016-12-22 (×3): 1000 mg via INTRAVENOUS
  Filled 2016-12-21 (×4): qty 100

## 2016-12-21 MED ORDER — DIPHENHYDRAMINE HCL 12.5 MG/5ML PO ELIX: 12.5000 mg | ORAL_SOLUTION | ORAL | Status: DC | PRN

## 2016-12-21 MED ORDER — PROPOFOL 500 MG/50ML IV EMUL
INTRAVENOUS | Status: AC
  Filled 2016-12-21: qty 50

## 2016-12-21 MED ORDER — FERROUS SULFATE 325 (65 FE) MG PO TABS
325.0000 mg | ORAL_TABLET | Freq: Two times a day (BID) | ORAL | Status: DC
  Administered 2016-12-21 – 2016-12-23 (×4): 325 mg via ORAL
  Filled 2016-12-21 (×4): qty 1

## 2016-12-21 MED ORDER — FLEET ENEMA 7-19 GM/118ML RE ENEM: 1.0000 | ENEMA | Freq: Once | RECTAL | Status: DC | PRN

## 2016-12-21 MED ORDER — FENTANYL CITRATE (PF) 100 MCG/2ML IJ SOLN
INTRAMUSCULAR | Status: AC
  Filled 2016-12-21: qty 2

## 2016-12-21 MED ORDER — OMEGA-3-ACID ETHYL ESTERS 1 G PO CAPS
2.0000 g | ORAL_CAPSULE | Freq: Every day | ORAL | Status: DC
  Administered 2016-12-22: 2 g via ORAL
  Filled 2016-12-21: qty 2

## 2016-12-21 MED ORDER — TETRACAINE HCL 1 % IJ SOLN
INTRAMUSCULAR | Status: AC
Start: 2016-12-21 — End: 2016-12-21
  Filled 2016-12-21: qty 2

## 2016-12-21 MED ORDER — DEXAMETHASONE SODIUM PHOSPHATE 10 MG/ML IJ SOLN
INTRAMUSCULAR | Status: AC
  Filled 2016-12-21: qty 1

## 2016-12-21 MED ORDER — ONDANSETRON HCL 4 MG PO TABS: 4.0000 mg | ORAL_TABLET | Freq: Four times a day (QID) | ORAL | Status: DC | PRN

## 2016-12-21 MED ORDER — BUPIVACAINE HCL (PF) 0.25 % IJ SOLN
INTRAMUSCULAR | Status: DC | PRN
  Administered 2016-12-21: 60 mL

## 2016-12-21 MED ORDER — LACTATED RINGERS IV SOLN
INTRAVENOUS | Status: DC
  Administered 2016-12-21 (×2): via INTRAVENOUS

## 2016-12-21 MED ORDER — SODIUM CHLORIDE 0.9 % IV SOLN
INTRAVENOUS | Status: DC | PRN
  Administered 2016-12-21: 30 ug/min via INTRAVENOUS

## 2016-12-21 MED ORDER — ACETAMINOPHEN 10 MG/ML IV SOLN
INTRAVENOUS | Status: DC | PRN
  Administered 2016-12-21: 1000 mg via INTRAVENOUS

## 2016-12-21 MED ORDER — LIDOCAINE HCL (PF) 2 % IJ SOLN
INTRAMUSCULAR | Status: AC
  Filled 2016-12-21: qty 2

## 2016-12-21 MED ORDER — BISACODYL 10 MG RE SUPP
10.0000 mg | Freq: Every day | RECTAL | Status: DC | PRN
  Administered 2016-12-23: 10 mg via RECTAL
  Filled 2016-12-21: qty 1

## 2016-12-21 MED ORDER — VITAMIN D 1000 UNITS PO TABS
5000.0000 [IU] | ORAL_TABLET | Freq: Every day | ORAL | Status: DC
  Administered 2016-12-22: 5000 [IU] via ORAL
  Filled 2016-12-21: qty 5

## 2016-12-21 MED ORDER — ACETAMINOPHEN 325 MG PO TABS
650.0000 mg | ORAL_TABLET | Freq: Four times a day (QID) | ORAL | Status: DC | PRN
  Administered 2016-12-23: 650 mg via ORAL
  Filled 2016-12-21: qty 2

## 2016-12-21 MED ORDER — FENTANYL CITRATE (PF) 100 MCG/2ML IJ SOLN
INTRAMUSCULAR | Status: DC | PRN
  Administered 2016-12-21: 25 ug via INTRAVENOUS

## 2016-12-21 MED ORDER — SENNOSIDES-DOCUSATE SODIUM 8.6-50 MG PO TABS
1.0000 | ORAL_TABLET | Freq: Two times a day (BID) | ORAL | Status: DC
  Administered 2016-12-21 – 2016-12-22 (×3): 1 via ORAL
  Filled 2016-12-21 (×3): qty 1

## 2016-12-21 MED ORDER — VITAMIN E 180 MG (400 UNIT) PO CAPS
400.0000 [IU] | ORAL_CAPSULE | Freq: Every day | ORAL | Status: DC
  Administered 2016-12-22: 400 [IU] via ORAL
  Filled 2016-12-21 (×3): qty 1

## 2016-12-21 MED ORDER — TRANEXAMIC ACID 1000 MG/10ML IV SOLN
1000.0000 mg | Freq: Once | INTRAVENOUS | Status: AC
  Administered 2016-12-21: 1000 mg via INTRAVENOUS
  Filled 2016-12-21: qty 10

## 2016-12-21 MED ORDER — SODIUM CHLORIDE 0.9 % IJ SOLN
INTRAMUSCULAR | Status: AC
  Filled 2016-12-21: qty 50

## 2016-12-21 MED ORDER — FENTANYL CITRATE (PF) 100 MCG/2ML IJ SOLN
25.0000 ug | INTRAMUSCULAR | Status: DC | PRN
  Administered 2016-12-21 (×2): 25 ug via INTRAVENOUS

## 2016-12-21 MED ORDER — MAGNESIUM HYDROXIDE 400 MG/5ML PO SUSP
30.0000 mL | Freq: Every day | ORAL | Status: DC | PRN
Start: 2016-12-21 — End: 2016-12-23
  Administered 2016-12-22 – 2016-12-23 (×2): 30 mL via ORAL
  Filled 2016-12-21 (×2): qty 30

## 2016-12-21 MED ORDER — FAMOTIDINE 20 MG PO TABS
ORAL_TABLET | ORAL | Status: AC
  Administered 2016-12-21: 20 mg via ORAL
  Filled 2016-12-21: qty 1

## 2016-12-21 MED ORDER — MIDAZOLAM HCL 5 MG/5ML IJ SOLN
INTRAMUSCULAR | Status: DC | PRN
  Administered 2016-12-21: 2 mg via INTRAVENOUS

## 2016-12-21 MED ORDER — FAMOTIDINE 20 MG PO TABS
20.0000 mg | ORAL_TABLET | Freq: Once | ORAL | Status: AC
  Administered 2016-12-21: 20 mg via ORAL

## 2016-12-21 MED ORDER — MORPHINE SULFATE (PF) 2 MG/ML IV SOLN
2.0000 mg | INTRAVENOUS | Status: DC | PRN
  Administered 2016-12-23: 2 mg via INTRAVENOUS
  Filled 2016-12-21: qty 1

## 2016-12-21 MED ORDER — METOCLOPRAMIDE HCL 10 MG PO TABS
10.0000 mg | ORAL_TABLET | Freq: Three times a day (TID) | ORAL | Status: DC
  Administered 2016-12-21 – 2016-12-23 (×7): 10 mg via ORAL
  Filled 2016-12-21 (×7): qty 1

## 2016-12-21 MED ORDER — DEXAMETHASONE SODIUM PHOSPHATE 4 MG/ML IJ SOLN
INTRAMUSCULAR | Status: DC | PRN
  Administered 2016-12-21: 6 mg via INTRAVENOUS

## 2016-12-21 MED ORDER — ACETAMINOPHEN 650 MG RE SUPP: 650.0000 mg | Freq: Four times a day (QID) | RECTAL | Status: DC | PRN

## 2016-12-21 MED ORDER — CLINDAMYCIN PHOSPHATE 900 MG/50ML IV SOLN
INTRAVENOUS | Status: DC | PRN
  Administered 2016-12-21: 900 mg via INTRAVENOUS

## 2016-12-21 MED ORDER — ACETAMINOPHEN 10 MG/ML IV SOLN
INTRAVENOUS | Status: AC
  Filled 2016-12-21: qty 100

## 2016-12-21 MED ORDER — BUPIVACAINE HCL (PF) 0.25 % IJ SOLN
INTRAMUSCULAR | Status: AC
  Filled 2016-12-21: qty 60

## 2016-12-21 MED ORDER — FENTANYL CITRATE (PF) 100 MCG/2ML IJ SOLN: 25.0000 ug | INTRAMUSCULAR | Status: DC | PRN

## 2016-12-21 MED ORDER — SODIUM CHLORIDE 0.9 % IV SOLN
INTRAVENOUS | Status: DC
  Administered 2016-12-21 – 2016-12-22 (×2): via INTRAVENOUS

## 2016-12-21 SURGICAL SUPPLY — 63 items
BATTERY INSTRU NAVIGATION (MISCELLANEOUS) ×8 IMPLANT
BLADE CLIPPER SURG (BLADE) ×2 IMPLANT
BLADE SAW 1 (BLADE) ×2 IMPLANT
BLADE SAW 1/2 (BLADE) ×2 IMPLANT
BLADE SAW 70X12.5 (BLADE) ×2 IMPLANT
CANISTER SUCT 1200ML W/VALVE (MISCELLANEOUS) ×2 IMPLANT
CANISTER SUCT 3000ML PPV (MISCELLANEOUS) ×4 IMPLANT
CAPT KNEE TOTAL 3 ATTUNE ×2 IMPLANT
CATH TRAY METER 16FR LF (MISCELLANEOUS) ×2 IMPLANT
CEMENT HV SMART SET (Cement) ×4 IMPLANT
COOLER POLAR GLACIER W/PUMP (MISCELLANEOUS) ×2 IMPLANT
CUFF TOURN 24 STER (MISCELLANEOUS) IMPLANT
CUFF TOURN 30 STER DUAL PORT (MISCELLANEOUS) ×2 IMPLANT
DECANTER SPIKE VIAL GLASS SM (MISCELLANEOUS) ×6 IMPLANT
DRAPE SHEET LG 3/4 BI-LAMINATE (DRAPES) ×4 IMPLANT
DRSG DERMACEA 8X12 NADH (GAUZE/BANDAGES/DRESSINGS) ×2 IMPLANT
DRSG OPSITE POSTOP 4X14 (GAUZE/BANDAGES/DRESSINGS) ×2 IMPLANT
DRSG TEGADERM 4X4.75 (GAUZE/BANDAGES/DRESSINGS) ×4 IMPLANT
DURAPREP 26ML APPLICATOR (WOUND CARE) ×4 IMPLANT
ELECT CAUTERY BLADE 6.4 (BLADE) ×2 IMPLANT
ELECT REM PT RETURN 9FT ADLT (ELECTROSURGICAL) ×2
ELECTRODE REM PT RTRN 9FT ADLT (ELECTROSURGICAL) ×1 IMPLANT
EVACUATOR 1/8 PVC DRAIN (DRAIN) ×2 IMPLANT
EX-PIN ORTHOLOCK NAV 4X150 (PIN) ×4 IMPLANT
GLOVE BIOGEL M STRL SZ7.5 (GLOVE) ×4 IMPLANT
GLOVE BIOGEL PI IND STRL 9 (GLOVE) ×1 IMPLANT
GLOVE BIOGEL PI INDICATOR 9 (GLOVE) ×1
GLOVE INDICATOR 8.0 STRL GRN (GLOVE) ×2 IMPLANT
GLOVE SURG SYN 9.0  PF PI (GLOVE) ×1
GLOVE SURG SYN 9.0 PF PI (GLOVE) ×1 IMPLANT
GOWN STRL REUS W/ TWL LRG LVL3 (GOWN DISPOSABLE) ×2 IMPLANT
GOWN STRL REUS W/TWL 2XL LVL3 (GOWN DISPOSABLE) ×2 IMPLANT
GOWN STRL REUS W/TWL LRG LVL3 (GOWN DISPOSABLE) ×2
HOLDER FOLEY CATH W/STRAP (MISCELLANEOUS) ×2 IMPLANT
HOOD PEEL AWAY FLYTE STAYCOOL (MISCELLANEOUS) ×6 IMPLANT
KIT RM TURNOVER STRD PROC AR (KITS) ×2 IMPLANT
KNIFE SCULPS 14X20 (INSTRUMENTS) ×2 IMPLANT
LABEL OR SOLS (LABEL) ×2 IMPLANT
NDL SAFETY 18GX1.5 (NEEDLE) ×2 IMPLANT
NEEDLE SPNL 20GX3.5 QUINCKE YW (NEEDLE) ×2 IMPLANT
NS IRRIG 500ML POUR BTL (IV SOLUTION) ×2 IMPLANT
PACK TOTAL KNEE (MISCELLANEOUS) ×2 IMPLANT
PAD WRAPON POLAR KNEE (MISCELLANEOUS) ×1 IMPLANT
PIN FIXATION 1/8DIA X 3INL (PIN) ×2 IMPLANT
PULSAVAC PLUS IRRIG FAN TIP (DISPOSABLE) ×2
SOL .9 NS 3000ML IRR  AL (IV SOLUTION) ×1
SOL .9 NS 3000ML IRR UROMATIC (IV SOLUTION) ×1 IMPLANT
SOL PREP PVP 2OZ (MISCELLANEOUS) ×2
SOLUTION PREP PVP 2OZ (MISCELLANEOUS) ×1 IMPLANT
SPONGE DRAIN TRACH 4X4 STRL 2S (GAUZE/BANDAGES/DRESSINGS) ×2 IMPLANT
STAPLER SKIN PROX 35W (STAPLE) ×2 IMPLANT
STRAP TIBIA SHORT (MISCELLANEOUS) ×2 IMPLANT
SUCTION FRAZIER HANDLE 10FR (MISCELLANEOUS) ×1
SUCTION TUBE FRAZIER 10FR DISP (MISCELLANEOUS) ×1 IMPLANT
SUT VIC AB 0 CT1 36 (SUTURE) ×2 IMPLANT
SUT VIC AB 1 CT1 36 (SUTURE) ×4 IMPLANT
SUT VIC AB 2-0 CT2 27 (SUTURE) ×2 IMPLANT
SYR 20CC LL (SYRINGE) ×2 IMPLANT
SYR 30ML LL (SYRINGE) ×4 IMPLANT
TIP FAN IRRIG PULSAVAC PLUS (DISPOSABLE) ×1 IMPLANT
TOWEL OR 17X26 4PK STRL BLUE (TOWEL DISPOSABLE) IMPLANT
TOWER CARTRIDGE SMART MIX (DISPOSABLE) ×2 IMPLANT
WRAPON POLAR PAD KNEE (MISCELLANEOUS) ×2

## 2016-12-21 NOTE — H&P (Signed)
The patient has been re-examined, and the chart reviewed, and there have been no interval changes to the documented history and physical.    The risks, benefits, and alternatives have been discussed at length. The patient expressed understanding of the risks benefits and agreed with plans for surgical intervention.  Avriana Joo P. Aviannah Castoro, Jr. M.D.    

## 2016-12-21 NOTE — Transfer of Care (Signed)
Immediate Anesthesia Transfer of Care Note  Patient: Jillian Nelson  Procedure(s) Performed: Procedure(s): COMPUTER ASSISTED TOTAL KNEE ARTHROPLASTY (Right)  Patient Location: PACU  Anesthesia Type:Spinal  Level of Consciousness: awake  Airway & Oxygen Therapy: Patient connected to nasal cannula oxygen  Post-op Assessment: Post -op Vital signs reviewed and stable  Post vital signs: stable  Last Vitals:  Vitals:   12/21/16 1007 12/21/16 1528  BP: (!) 151/83 (!) 112/48  Pulse: 75 92  Resp: 18 16  Temp: 36.9 C 36.9 C  SpO2: 97% 98%    Last Pain:  Vitals:   12/21/16 1007  TempSrc: Oral  PainSc: 0-No pain         Complications: No apparent anesthesia complications

## 2016-12-21 NOTE — Op Note (Signed)
OPERATIVE NOTE  DATE OF SURGERY:  12/21/2016  PATIENT NAME:  Jillian ChadSonja K Nelson   DOB: 06/27/1955  MRN: [id number]  PRE-OPERATIVE DIAGNOSIS: Degenerative arthrosis of the right knee, primary  POST-OPERATIVE DIAGNOSIS:  Same  PROCEDURE:  Right total knee arthroplasty using computer-assisted navigation  SURGEON:  Jena GaussJames P Akshath Mccarey, Jr. M.D.  ASSISTANT:  Van ClinesJon Wolfe, PA (present and scrubbed throughout the case, critical for assistance with exposure, retraction, instrumentation, and closure)  ANESTHESIA: spinal  ESTIMATED BLOOD LOSS: 50 mL  FLUIDS REPLACED: 1000 mL of crystalloid  TOURNIQUET TIME: 94 minutes  DRAINS: 2 medium Hemovac drains  SOFT TISSUE RELEASES: Anterior cruciate ligament, posterior cruciate ligament, deep medial collateral ligament, patellofemoral ligament  IMPLANTS UTILIZED: DePuy Attune size 4N posterior stabilized femoral component (cemented), size 3 rotating platform tibial component (cemented), 35 mm medialized dome patella (cemented), and a 6 mm stabilized rotating platform polyethylene insert.  INDICATIONS FOR SURGERY: Jillian Nelson is a 61 y.o. year old female with a long history of progressive knee pain. X-rays demonstrated severe degenerative changes in tricompartmental fashion. The patient had not seen any significant improvement despite conservative nonsurgical intervention. After discussion of the risks and benefits of surgical intervention, the patient expressed understanding of the risks benefits and agree with plans for total knee arthroplasty.   The risks, benefits, and alternatives were discussed at length including but not limited to the risks of infection, bleeding, nerve injury, stiffness, blood clots, the need for revision surgery, cardiopulmonary complications, among others, and they were willing to proceed.  PROCEDURE IN DETAIL: The patient was brought into the operating room and, after adequate spinal anesthesia was achieved, a tourniquet was placed on  the patient's upper thigh. The patient's knee and leg were cleaned and prepped with alcohol and DuraPrep and draped in the usual sterile fashion. A "timeout" was performed as per usual protocol. The lower extremity was exsanguinated using an Esmarch, and the tourniquet was inflated to 300 mmHg. An anterior longitudinal incision was made followed by a standard mid vastus approach. The deep fibers of the medial collateral ligament were elevated in a subperiosteal fashion off of the medial flare of the tibia so as to maintain a continuous soft tissue sleeve. The patella was subluxed laterally and the patellofemoral ligament was incised. Inspection of the knee demonstrated severe degenerative changes with full-thickness loss of articular cartilage. Osteophytes were debrided using a rongeur. Anterior and posterior cruciate ligaments were excised. Two 4.0 mm Schanz pins were inserted in the femur and into the tibia for attachment of the array of trackers used for computer-assisted navigation. Hip center was identified using a circumduction technique. Distal landmarks were mapped using the computer. The distal femur and proximal tibia were mapped using the computer. The distal femoral cutting guide was positioned using computer-assisted navigation so as to achieve a 5 distal valgus cut. The femur was sized and it was felt that a size 4N femoral component was appropriate. A size 4 femoral cutting guide was positioned and the anterior cut was performed and verified using the computer. This was followed by completion of the posterior and chamfer cuts. Femoral cutting guide for the central box was then positioned in the center box cut was performed.  Attention was then directed to the proximal tibia. Medial and lateral menisci were excised. The extramedullary tibial cutting guide was positioned using computer-assisted navigation so as to achieve a 0 varus-valgus alignment and 3 posterior slope. The cut was performed and  verified using the computer. The proximal tibia  was sized and it was felt that a size 3 tibial tray was appropriate. Tibial and femoral trials were inserted followed by insertion of a 6 mm polyethylene insert. This allowed for excellent mediolateral soft tissue balancing both in flexion and in full extension. Finally, the patella was cut and prepared so as to accommodate a 35 mm medialized dome patella. A patella trial was placed and the knee was placed through a range of motion with excellent patellar tracking appreciated. The femoral trial was removed after debridement of posterior osteophytes. The central post-hole for the tibial component was reamed followed by insertion of a keel punch. Tibial trials were then removed. Cut surfaces of bone were irrigated with copious amounts of normal saline with antibiotic solution using pulsatile lavage and then suctioned dry. Polymethylmethacrylate cement was prepared in the usual fashion using a vacuum mixer. Cement was applied to the cut surface of the proximal tibia as well as along the undersurface of a size 3 rotating platform tibial component. Tibial component was positioned and impacted into place. Excess cement was removed using Personal assistant. Cement was then applied to the cut surfaces of the femur as well as along the posterior flanges of the size 4N femoral component. The femoral component was positioned and impacted into place. Excess cement was removed using Personal assistant. A 6 mm polyethylene trial was inserted and the knee was brought into full extension with steady axial compression applied. Finally, cement was applied to the backside of a 35 mm medialized dome patella and the patellar component was positioned and patellar clamp applied. Excess cement was removed using Personal assistant. After adequate curing of the cement, the tourniquet was deflated after a total tourniquet time of 94 minutes. Hemostasis was achieved using electrocautery. The knee was  irrigated with copious amounts of normal saline with antibiotic solution using pulsatile lavage and then suctioned dry. 20 mL of 1.3% Exparel and 60 mL of 0.25% Marcaine in 40 mL of normal saline was injected along the posterior capsule, medial and lateral gutters, and along the arthrotomy site. A 6 mm stabilized rotating platform polyethylene insert was inserted and the knee was placed through a range of motion with excellent mediolateral soft tissue balancing appreciated and excellent patellar tracking noted. 2 medium drains were placed in the wound bed and brought out through separate stab incisions. The medial parapatellar portion of the incision was reapproximated using interrupted sutures of #1 Vicryl. Subcutaneous tissue was approximated in layers using first #0 Vicryl followed #2-0 Vicryl. The skin was approximated with skin staples. A sterile dressing was applied.  The patient tolerated the procedure well and was transported to the recovery room in stable condition.    Dafne Nield P. Angie Fava., M.D.

## 2016-12-21 NOTE — Anesthesia Post-op Follow-up Note (Signed)
Anesthesia QCDR form completed.        

## 2016-12-21 NOTE — Anesthesia Preprocedure Evaluation (Addendum)
Anesthesia Evaluation  Patient identified by MRN, date of birth, ID band Patient awake    Reviewed: Allergy & Precautions, NPO status , Patient's Chart, lab work & pertinent test results  History of Anesthesia Complications (+) Family history of anesthesia reaction  Airway Mallampati: III  TM Distance: <3 FB     Dental  (+) Caps, Chipped   Pulmonary former smoker,    Pulmonary exam normal        Cardiovascular negative cardio ROS Normal cardiovascular exam     Neuro/Psych Anxiety negative neurological ROS     GI/Hepatic Neg liver ROS, GERD  Medicated,  Endo/Other  negative endocrine ROS  Renal/GU negative Renal ROS  negative genitourinary   Musculoskeletal  (+) Arthritis , Osteoarthritis,    Abdominal Normal abdominal exam  (+)   Peds negative pediatric ROS (+)  Hematology negative hematology ROS (+)   Anesthesia Other Findings   Reproductive/Obstetrics                            Anesthesia Physical Anesthesia Plan  ASA: II  Anesthesia Plan: Spinal   Post-op Pain Management:    Induction: Intravenous  PONV Risk Score and Plan:   Airway Management Planned: Nasal Cannula  Additional Equipment:   Intra-op Plan:   Post-operative Plan:   Informed Consent: I have reviewed the patients History and Physical, chart, labs and discussed the procedure including the risks, benefits and alternatives for the proposed anesthesia with the patient or authorized representative who has indicated his/her understanding and acceptance.   Dental advisory given  Plan Discussed with: CRNA and Surgeon  Anesthesia Plan Comments:         Anesthesia Quick Evaluation

## 2016-12-21 NOTE — Anesthesia Procedure Notes (Signed)
Spinal  Patient location during procedure: OR Start time: 12/21/2016 11:54 AM End time: 12/21/2016 11:59 AM Staffing Performed: resident/CRNA  Preanesthetic Checklist Completed: patient identified, site marked, surgical consent, pre-op evaluation, timeout performed, IV checked, risks and benefits discussed and monitors and equipment checked Spinal Block Patient position: sitting Prep: Betadine Patient monitoring: heart rate, continuous pulse ox, blood pressure and cardiac monitor Approach: midline Location: L4-5 Injection technique: single-shot Needle Needle type: Introducer and Pencan  Needle gauge: 24 G Needle length: 9 cm Additional Notes Negative paresthesia. Negative blood return. Positive free-flowing CSF. Expiration date of kit checked and confirmed. Patient tolerated procedure well, without complications.

## 2016-12-22 LAB — CBC
HCT: 34.3 % — ABNORMAL LOW (ref 35.0–47.0)
Hemoglobin: 11.6 g/dL — ABNORMAL LOW (ref 12.0–16.0)
MCH: 31.9 pg (ref 26.0–34.0)
MCHC: 33.8 g/dL (ref 32.0–36.0)
MCV: 94.3 fL (ref 80.0–100.0)
PLATELETS: 275 10*3/uL (ref 150–440)
RBC: 3.63 MIL/uL — ABNORMAL LOW (ref 3.80–5.20)
RDW: 12.9 % (ref 11.5–14.5)
WBC: 13.5 10*3/uL — ABNORMAL HIGH (ref 3.6–11.0)

## 2016-12-22 LAB — BASIC METABOLIC PANEL
Anion gap: 6 (ref 5–15)
BUN: 12 mg/dL (ref 6–20)
CALCIUM: 8.6 mg/dL — AB (ref 8.9–10.3)
CHLORIDE: 108 mmol/L (ref 101–111)
CO2: 24 mmol/L (ref 22–32)
CREATININE: 0.58 mg/dL (ref 0.44–1.00)
Glucose, Bld: 118 mg/dL — ABNORMAL HIGH (ref 65–99)
Potassium: 4.1 mmol/L (ref 3.5–5.1)
SODIUM: 138 mmol/L (ref 135–145)

## 2016-12-22 NOTE — Progress Notes (Signed)
Physical Therapy Treatment Patient Details Name: Jillian Nelson MRN: [id number] DOB: Oct 06, 1955 Today's Date: 12/22/2016    History of Present Illness Pt is a 61 y.o. F s/p R TKA 12/21/2016. PMH: GERD, anxiety, L plantar fascia surgery (2002).    PT Comments    Pt able to ambulate nursing loop during today's session with CGA and RW. Pt reports 3/10 R knee pain beginning and end of session; 4/10 R knee pain reported with activity. Pt requires vc's for proper R LE heel strike and knee extension. Next session focus on increasing activity tolerance, ROM, and initiating stair training.    Follow Up Recommendations  Home health PT     Equipment Recommendations  Rolling walker with 5" wheels    Recommendations for Other Services       Precautions / Restrictions Precautions Precautions: Knee;Fall Precaution Booklet Issued: Yes (comment) Required Braces or Orthoses:  (able to perform 10 R SLR; no knee immobilizer) Restrictions Weight Bearing Restrictions: Yes RLE Weight Bearing: Weight bearing as tolerated    Mobility  Bed Mobility Overal bed mobility: Modified Independent             General bed mobility comments: pt able to perform sit to supine with modified independence  Transfers Overall transfer level: Needs assistance Equipment used: Rolling walker (2 wheeled) Transfers: Sit to/from Stand Sit to Stand: Min guard         General transfer comment: pt demonstrates proper technique with sit to stands with CGA and RW for safety  Ambulation/Gait Ambulation/Gait assistance: Min guard Ambulation Distance (Feet): 220 Feet Assistive device: Rolling walker (2 wheeled)   Gait velocity: decreased   General Gait Details: pt able to ambulate 220 ft with step-to gait pattern from bedside chair around the nursing loop and back to bed with CGA and RW for safety; pt continues to require vc's for proper R LE heel strike   Stairs            Wheelchair Mobility     Modified Rankin (Stroke Patients Only)       Balance Overall balance assessment: Needs assistance Sitting-balance support: No upper extremity supported;Feet supported Sitting balance-Leahy Scale: Good Sitting balance - Comments: pt able to perform sitting balance with no UE support and reaching inside base of support with no loss of balance noted   Standing balance support: Bilateral upper extremity supported;During functional activity Standing balance-Leahy Scale: Fair Standing balance comment: pt able to perform static and dynamic standing balance with CGA and RW; able to remove B UE to wipe and position gown for toileting purposes                            Cognition Arousal/Alertness: Awake/alert Behavior During Therapy: WFL for tasks assessed/performed Overall Cognitive Status: Within Functional Limits for tasks assessed                                        Exercises Total Joint Exercises Long Arc Quad: AROM;Strengthening;Right;10 reps;Seated Knee Flexion: AROM;Right;10 reps;Seated (wash cloth under R foot to assist with slide)    General Comments General comments (skin integrity, edema, etc.): Hemovac in place      Pertinent Vitals/Pain Pain Assessment: 0-10 Pain Score: 4  Pain Location: pt reports 3/10 R knee pain beginning and end of session; 4/10 R knee pain reported with activity Pain  Descriptors / Indicators: Aching;Discomfort;Operative site guarding;Tender Pain Intervention(s): Limited activity within patient's tolerance;Monitored during session;Premedicated before session;Ice applied;Repositioned HR - 61 to 78 bpm throughout session via pulse ox O2 - 96 to 100% on RA throughout session via pulse ox    Home Living                      Prior Function            PT Goals (current goals can now be found in the care plan section) Acute Rehab PT Goals Patient Stated Goal: to return home PT Goal Formulation: With  patient Time For Goal Achievement: 01/05/17 Potential to Achieve Goals: Good Progress towards PT goals: Progressing toward goals    Frequency    BID      PT Plan Current plan remains appropriate    Co-evaluation              AM-PAC PT "6 Clicks" Daily Activity  Outcome Measure  Difficulty turning over in bed (including adjusting bedclothes, sheets and blankets)?: None Difficulty moving from lying on back to sitting on the side of the bed? : A Little Difficulty sitting down on and standing up from a chair with arms (e.g., wheelchair, bedside commode, etc,.)?: A Little Help needed moving to and from a bed to chair (including a wheelchair)?: A Little Help needed walking in hospital room?: A Little Help needed climbing 3-5 steps with a railing? : A Little 6 Click Score: 19    End of Session Equipment Utilized During Treatment: Gait belt Activity Tolerance: Patient tolerated treatment well Patient left: in bed;with call bell/phone within reach;with bed alarm set;with family/visitor present;with SCD's reapplied (R heel elevated via bone foam; L heel elevated via towel roll; SCD's in place and activated; polar care in place and activated) Nurse Communication: Mobility status;Weight bearing status PT Visit Diagnosis: Other abnormalities of gait and mobility (R26.89);Muscle weakness (generalized) (M62.81);Pain Pain - Right/Left: Right Pain - part of body: Knee     Time: 1520-1546 PT Time Calculation (min) (ACUTE ONLY): 26 min  Charges:                       G CodesGwenlyn Found:       Keigo Whalley, SPT 12/22/16,4:49 PM [phone]

## 2016-12-22 NOTE — Progress Notes (Signed)
Clinical Social Worker (CSW) received SNF consult. PT is recommending home health. RN case manager is aware of above. Please reconsult if future social work needs arise. CSW signing off.   Aubreyanna Dorrough, LCSW [phone] 

## 2016-12-22 NOTE — Progress Notes (Signed)
   Subjective: 1 Day Post-Op Procedure(s) (LRB): COMPUTER ASSISTED TOTAL KNEE ARTHROPLASTY (Right) Patient reports pain as 6 on 0-10 scale.   Patient is well, and has had no acute complaints or problems We will start therapy today.  Plan is to go Home after hospital stay. no nausea and no vomiting Patient denies any chest pains or shortness of breath. Objective: Vital signs in last 24 hours: Temp:  [97.6 F (36.4 C)-98.7 F (37.1 C)] 98.7 F (37.1 C) (08/14 0000) Pulse Rate:  [66-92] 66 (08/14 0000) Resp:  [11-18] 18 (08/14 0000) BP: (112-151)/(48-86) 133/62 (08/14 0000) SpO2:  [97 %-100 %] 98 % (08/14 0000) Weight:  [73.5 kg (161 lb 15.9 oz)] 73.5 kg (161 lb 15.9 oz) (08/13 1658) Heels are non tender and elevated off the bed using rolled towels as well as bone foam under operative foot Intake/Output from previous day: 08/13 0701 - 08/14 0700 In: 2380 [P.O.:880; I.V.:1200; IV Piggyback:300] Out: 1305 [Urine:1125; Drains:130; Blood:50] Intake/Output this shift: No intake/output data recorded.   Recent Labs  12/22/16 0448  HGB 11.6*    Recent Labs  12/22/16 0448  WBC 13.5*  RBC 3.63*  HCT 34.3*  PLT 275    Recent Labs  12/22/16 0448  NA 138  K 4.1  CL 108  CO2 24  BUN 12  CREATININE 0.58  GLUCOSE 118*  CALCIUM 8.6*   No results for input(s): LABPT, INR in the last 72 hours.  EXAM General - Patient is Alert, Appropriate and Oriented Extremity - Neurologically intact Neurovascular intact Sensation intact distally Intact pulses distally Dorsiflexion/Plantar flexion intact Compartment soft Dressing - dressing C/D/I Motor Function - intact, moving foot and toes well on exam.    Past Medical History:  Diagnosis Date  . Anxiety   . Arthritis   . Family history of adverse reaction to anesthesia    mom is hard to wake up after anesthesia.  Marland Kitchen. GERD (gastroesophageal reflux disease)     Assessment/Plan: 1 Day Post-Op Procedure(s) (LRB): COMPUTER  ASSISTED TOTAL KNEE ARTHROPLASTY (Right) Active Problems:   S/P total knee arthroplasty  Estimated body mass index is 29.63 kg/m as calculated from the following:   Height as of this encounter: 5\' 2"  (1.575 m).   Weight as of this encounter: 73.5 kg (161 lb 15.9 oz). Advance diet Up with therapy D/C IV fluids Plan for discharge tomorrow Discharge home with home health  Labs: Were reviewed. Hemoglobin 11.6. Vital signs stable. DVT Prophylaxis - Lovenox, Foot Pumps and TED hose Weight-Bearing as tolerated to right leg D/C O2 and Pulse OX and try on Room [facility] tomorrow morning Begin working on bowel movement  Kwaku Mostafa R. [facility] PA [facility] 12/22/2016, 7:31 AM

## 2016-12-22 NOTE — NC FL2 (Signed)
[place] MEDICAID FL2 LEVEL OF CARE SCREENING TOOL     IDENTIFICATION  Patient Name: Jillian Nelson Birthdate: December 09, 1955 Sex: female Admission Date (Current Location): 12/21/2016  [place] Number:  Chiropodist and Address:  [facility], [address], [place], [place] [address]      Provider Number: [id number]  Attending Physician Name and Address:  Donato Heinz, MD  Relative Name and Phone Number:       Current Level of Care: Hospital Recommended Level of Care: Skilled Nursing Facility Prior Approval Number:    Date Approved/Denied:   PASRR Number:  [id number])  Discharge Plan: SNF    Current Diagnoses: Patient Active Problem List   Diagnosis Date Noted  . S/P total knee arthroplasty 12/21/2016    Orientation RESPIRATION BLADDER Height & Weight     Self, Time, Situation, Place  Normal Continent Weight: 161 lb 15.9 oz (73.5 kg) Height:  5\' 2"  (157.5 cm)  BEHAVIORAL SYMPTOMS/MOOD NEUROLOGICAL BOWEL NUTRITION STATUS   (none)  (none) Continent Diet (Regular Diet )  AMBULATORY STATUS COMMUNICATION OF NEEDS Skin   Extensive Assist Verbally Surgical wounds (Incision: Right Knee )                       Personal Care Assistance Level of Assistance  Bathing, Feeding, Dressing Bathing Assistance: Limited assistance Feeding assistance: Independent Dressing Assistance: Limited assistance     Functional Limitations Info  Hearing, Sight, Speech Sight Info: Adequate Hearing Info: Adequate Speech Info: Adequate    SPECIAL CARE FACTORS FREQUENCY  PT (By licensed PT), OT (By licensed OT)     PT Frequency:  (5) OT Frequency:  (5)            Contractures      Additional Factors Info  Code Status, Allergies Code Status Info:  (Full Code. ) Allergies Info:  (Penicillins, Sulfa Antibiotics, Latex)           Current Medications (12/22/2016):  This is the current hospital active medication list Current  Facility-Administered Medications  Medication Dose Route Frequency Provider Last Rate Last Dose  . 0.9 %  sodium chloride infusion   Intravenous Continuous Hooten, Illene Labrador, MD 100 mL/hr at 12/22/16 0517    . acetaminophen (OFIRMEV) IV 1,000 mg  1,000 mg Intravenous Q6H Hooten, Illene Labrador, MD   Stopped at 12/22/16 0532  . acetaminophen (TYLENOL) tablet 650 mg  650 mg Oral Q6H PRN Hooten, Illene Labrador, MD       Or  . acetaminophen (TYLENOL) suppository 650 mg  650 mg Rectal Q6H PRN Hooten, Illene Labrador, MD      . alum & mag hydroxide-simeth (MAALOX/MYLANTA) 200-200-20 MG/5ML suspension 30 mL  30 mL Oral Q4H PRN Hooten, Illene Labrador, MD      . bisacodyl (DULCOLAX) suppository 10 mg  10 mg Rectal Daily PRN Hooten, Illene Labrador, MD      . cholecalciferol (VITAMIN D) tablet 5,000 Units  5,000 Units Oral Daily Hooten, Illene Labrador, MD      . clindamycin (CLEOCIN) IVPB 600 mg  600 mg Intravenous Q6H Hooten, Illene Labrador, MD   Stopped at 12/22/16 [phone]  . diphenhydrAMINE (BENADRYL) 12.5 MG/5ML elixir 12.5-25 mg  12.5-25 mg Oral Q4H PRN Hooten, Illene Labrador, MD      . enoxaparin (LOVENOX) injection 30 mg  30 mg Subcutaneous Q12H Hooten, Illene Labrador, MD   30 mg at 12/22/16 0841  . ferrous sulfate tablet 325  mg  325 mg Oral BID WC Donato HeinzHooten, James P, MD   325 mg at 12/22/16 0841  . magnesium hydroxide (MILK OF MAGNESIA) suspension 30 mL  30 mL Oral Daily PRN Hooten, Illene LabradorJames P, MD      . menthol-cetylpyridinium (CEPACOL) lozenge 3 mg  1 lozenge Oral PRN Hooten, Illene LabradorJames P, MD       Or  . phenol (CHLORASEPTIC) mouth spray 1 spray  1 spray Mouth/Throat PRN Hooten, Illene LabradorJames P, MD      . metoCLOPramide (REGLAN) tablet 10 mg  10 mg Oral TID AC & HS Hooten, Illene LabradorJames P, MD   10 mg at 12/22/16 0841  . morphine 2 MG/ML injection 2 mg  2 mg Intravenous Q2H PRN Hooten, Illene LabradorJames P, MD      . multivitamin with minerals tablet 1 tablet  1 tablet Oral Daily Hooten, Illene LabradorJames P, MD      . omega-3 acid ethyl esters (LOVAZA) capsule 2 g  2 g Oral Daily Hooten, Illene LabradorJames P, MD      .  ondansetron (ZOFRAN) tablet 4 mg  4 mg Oral Q6H PRN Hooten, Illene LabradorJames P, MD       Or  . ondansetron (ZOFRAN) injection 4 mg  4 mg Intravenous Q6H PRN Hooten, Illene LabradorJames P, MD      . oxyCODONE (Oxy IR/ROXICODONE) immediate release tablet 5-10 mg  5-10 mg Oral Q4H PRN Donato HeinzHooten, James P, MD   10 mg at 12/22/16 [id number]  . pantoprazole (PROTONIX) EC tablet 40 mg  40 mg Oral BID Donato HeinzHooten, James P, MD   40 mg at 12/21/16 2051  . senna-docusate (Senokot-S) tablet 1 tablet  1 tablet Oral BID Donato HeinzHooten, James P, MD   1 tablet at 12/21/16 2051  . sodium phosphate (FLEET) 7-19 GM/118ML enema 1 enema  1 enema Rectal Once PRN Hooten, Illene LabradorJames P, MD      . traMADol Janean Sark(ULTRAM) tablet 50-100 mg  50-100 mg Oral Q4H PRN Donato HeinzHooten, James P, MD   50 mg at 12/21/16 2053  . vitamin E capsule 400 Units  400 Units Oral Daily Hooten, Illene LabradorJames P, MD         Discharge Medications: Please see discharge summary for a list of discharge medications.  Relevant Imaging Results:  Relevant Lab Results:   Additional Information  (SSN: [id number])  Danell Vazquez, Darleen CrockerBailey M, LCSW

## 2016-12-22 NOTE — Care Management (Addendum)
RNCM spoke again with patient regarding recommendation by PT for home health PT. She is now agreeing to home health PT. Patient has no preference of home health agency. RNCM checking to see what agency will go to the following address: [address] [place]  Phone [phone] and in-network. [facility] will not go to [place]; [facility] with [facility]. Lovenox cost $10.

## 2016-12-22 NOTE — Evaluation (Signed)
Occupational Therapy Evaluation Patient Details Name: Jillian Nelson MRN: [id number] DOB: 1955-05-24 Today's Date: 12/22/2016    History of Present Illness Pt is a 61 y.o. F s/p R TKA 12/21/2016. PMH: GERD, anxiety, L plantar fascia surgery (2002).   Clinical Impression   Pt seen for OT evaluation this date. Pt independent at baseline, driving, working full time, and eager to return to PLOF. Pt plans to stay with her mother (retired Lawyer) for 2 weeks who will assist with meals, cleaning, and ADL as needed with access to good home set up and available AE/DME. Pt educated in use of AE for LB ADL tasks, polar care mgt, and home/routines modifications to maximize safety. Pt verbalized understanding of all education/training provided. Do no anticipate any additional OT needs at this time. Will sign off. Please re-consult if additional needs arise.     Follow Up Recommendations  No OT follow up    Equipment Recommendations  None recommended by OT    Recommendations for Other Services       Precautions / Restrictions Precautions Precautions: Knee;Fall Precaution Booklet Issued: No Required Braces or Orthoses:  (able to perform 10 SLR - no knee immobilizer) Restrictions Weight Bearing Restrictions: Yes RLE Weight Bearing: Weight bearing as tolerated      Mobility Bed Mobility Overal bed mobility: Modified Independent             General bed mobility comments: deferred, pt in recliner for session  Transfers Overall transfer level: Needs assistance Equipment used: Rolling walker (2 wheeled) Transfers: Sit to/from Stand Sit to Stand: Min guard         General transfer comment: good confidence, safety awareness    Balance Overall balance assessment: Needs assistance Sitting-balance support: No upper extremity supported;Feet supported Sitting balance-Leahy Scale: Good Sitting balance - Comments: pt able to perform sitting balance with no UE support and reaching inside base of  support with no loss of balance noted   Standing balance support: Bilateral upper extremity supported;During functional activity Standing balance-Leahy Scale: Fair Standing balance comment: no LOB noted                           ADL either performed or assessed with clinical judgement   ADL Overall ADL's : Needs assistance/impaired                       Lower Body Dressing Details (indicate cue type and reason): Pt educated in use of AE for LB dressing tasks, pt verbalized understanding. Pt reports mother is retired Lawyer, plans to assist with compression stockings               General ADL Comments: pt generally min assist for LB ADL, mother able to assist as needed     Vision Baseline Vision/History: Wears glasses Wears Glasses: At all times Patient Visual Report: No change from baseline Vision Assessment?: No apparent visual deficits     Perception     Praxis      Pertinent Vitals/Pain Pain Assessment: 0-10 Pain Score: 3  Pain Location: R knee Pain Descriptors / Indicators: Aching;Discomfort;Operative site guarding;Tender Pain Intervention(s): Limited activity within patient's tolerance;Monitored during session;Premedicated before session;Ice applied     Hand Dominance     Extremity/Trunk Assessment Upper Extremity Assessment Upper Extremity Assessment: Overall WFL for tasks assessed   Lower Extremity Assessment Lower Extremity Assessment: Defer to PT evaluation;RLE deficits/detail RLE Deficits / Details: at least  grossly 3/5 for R ankle DF, R knee flexion/extension, R hip flexion/abduction RLE: Unable to fully assess due to pain   Cervical / Trunk Assessment Cervical / Trunk Assessment: Normal   Communication Communication Communication: No difficulties   Cognition Arousal/Alertness: Awake/alert Behavior During Therapy: WFL for tasks assessed/performed Overall Cognitive Status: Within Functional Limits for tasks assessed                                      General Comments  Hemovac in place    Exercises Other Exercises Other Exercises: pt educated in home/routines modifications and DME to assist with bathing, toileting; pt verbalized understanding Other Exercises: pt educated in polar care mgt, verbalized understanding   Shoulder Instructions      Home Living Family/patient expects to be discharged to:: Private residence Living Arrangements: Spouse/significant other;Children;Parent Available Help at Discharge: Family;Available 24 hours/day (pt plans to stay with mother for 2 weeks) Type of Home: House Home Access: Ramped entrance     Home Layout: One level     Bathroom Shower/Tub: Chief Strategy OfficerTub/shower unit   Bathroom Toilet: Standard     Home Equipment: Environmental consultantWalker - 2 wheels;Cane - single point;Bedside commode;Shower seat;Adaptive equipment Adaptive Equipment: Reacher Additional Comments: home access/layout/equipment as reported will be at pt's mother's home that pt can utilize      Prior Functioning/Environment Level of Independence: Independent        Comments: pt reports independence with ADLs and functional mobility, including driving, and working fulltime (lots of walking, heavy work, lifting, bending)        OT Problem List:        OT Treatment/Interventions:      OT Goals(Current goals can be found in the care plan section) Acute Rehab OT Goals Patient Stated Goal: to return home OT Goal Formulation: With patient Time For Goal Achievement: 01/05/17 Potential to Achieve Goals: Good  OT Frequency:     Barriers to D/C:            Co-evaluation              AM-PAC PT "6 Clicks" Daily Activity     Outcome Measure Help from another person eating meals?: None Help from another person taking care of personal grooming?: None Help from another person toileting, which includes using toliet, bedpan, or urinal?: A Little Help from another person bathing (including washing,  rinsing, drying)?: A Little Help from another person to put on and taking off regular upper body clothing?: None Help from another person to put on and taking off regular lower body clothing?: A Little 6 Click Score: 21   End of Session    Activity Tolerance: Patient tolerated treatment well Patient left: in chair;with call bell/phone within reach;with chair alarm set;with SCD's reapplied;Other (comment) (polar care in place)  OT Visit Diagnosis: Other abnormalities of gait and mobility (R26.89)                Time: 1045-1100 OT Time Calculation (min): 15 min Charges:  OT General Charges $OT Visit: 1 Procedure OT Evaluation $OT Eval Low Complexity: 1 Procedure G-Codes:     Richrd PrimeJamie Stiller, MPH, MS, OTR/L ascom [phone] 12/22/16, 11:12 AM

## 2016-12-22 NOTE — Discharge Summary (Signed)
Physician Discharge Summary  Patient ID: Jillian Nelson MRN: [id number] DOB/AGE: 59/07/1955 [age] y.o.  Admit date: 12/21/2016 Discharge date: 12/23/2016  Admission Diagnoses:  PRIMARY OSTEOARTHRITIS OF RIGHT KNEE   Discharge Diagnoses: Patient Active Problem List   Diagnosis Date Noted  . S/P total knee arthroplasty 12/21/2016    Past Medical History:  Diagnosis Date  . Anxiety   . Arthritis   . Family history of adverse reaction to anesthesia    mom is hard to wake up after anesthesia.  Marland Kitchen. GERD (gastroesophageal reflux disease)      Transfusion: No transfusions during this admission   Consultants (if any):   Discharged Condition: Improved  Hospital Course: Jillian Nelson is an 61 y.o. female who was admitted 12/21/2016 with a diagnosis of degenerative arthrosis right knee and went to the operating room on 12/21/2016 and underwent the above named procedures.    Surgeries:Procedure(s): COMPUTER ASSISTED TOTAL KNEE ARTHROPLASTY on 12/21/2016  PRE-OPERATIVE DIAGNOSIS: Degenerative arthrosis of the right knee, primary  POST-OPERATIVE DIAGNOSIS:  Same  PROCEDURE:  Right total knee arthroplasty using computer-assisted navigation  SURGEON:  Jena GaussJames P Hooten, Jr. M.D.  ASSISTANT:  Van ClinesJon Javonda Suh, PA (present and scrubbed throughout the case, critical for assistance with exposure, retraction, instrumentation, and closure)  ANESTHESIA: spinal  ESTIMATED BLOOD LOSS: 50 mL  FLUIDS REPLACED: 1000 mL of crystalloid  TOURNIQUET TIME: 94 minutes  DRAINS: 2 medium Hemovac drains  SOFT TISSUE RELEASES: Anterior cruciate ligament, posterior cruciate ligament, deep medial collateral ligament, patellofemoral ligament  IMPLANTS UTILIZED: DePuy Attune size 4N posterior stabilized femoral component (cemented), size 3 rotating platform tibial component (cemented), 35 mm medialized dome patella (cemented), and a 6 mm stabilized rotating platform polyethylene insert.  INDICATIONS FOR  SURGERY: Jillian Nelson is a 61 y.o. year old female with a long history of progressive knee pain. X-rays demonstrated severe degenerative changes in tricompartmental fashion. The patient had not seen any significant improvement despite conservative nonsurgical intervention. After discussion of the risks and benefits of surgical intervention, the patient expressed understanding of the risks benefits and agree with plans for total knee arthroplasty.   The risks, benefits, and alternatives were discussed at length including but not limited to the risks of infection, bleeding, nerve injury, stiffness, blood clots, the need for revision surgery, cardiopulmonary complications, among others, and they were willing to proceed. Patient tolerated the surgery well. No complications .Patient was taken to PACU where she was stabilized and then transferred to the orthopedic floor.  Patient started on Lovenox 30 mg q 12 hrs. Foot pumps applied bilaterally at 80 mm hgb. Heels elevated off bed with rolled towels. No evidence of DVT. Calves non tender. Negative Homan. Physical therapy started on day #1 for gait training and transfer with OT starting on  day #1 for ADL and assisted devices. Patient has done well with therapy. Ambulated greater than 200 feet upon being discharged.  Patient's IV And Foley were discontinued on day #1 with Hemovac being discontinued on day #2. Dressing was changed on day 2 prior to patient being discharged   She was given perioperative antibiotics:  Anti-infectives    Start     Dose/Rate Route Frequency Ordered Stop   12/21/16 1800  clindamycin (CLEOCIN) IVPB 600 mg     600 mg 100 mL/hr over 30 Minutes Intravenous Every 6 hours 12/21/16 1641 12/22/16 1759   12/21/16 0948  clindamycin (CLEOCIN) 900 MG/50ML IVPB    Comments:  Agnes Lawrenceobinson, Patricia  : cabinet override  12/21/16 0948 12/21/16 1212   12/21/16 0600  clindamycin (CLEOCIN) IVPB 900 mg  Status:  Discontinued     900 mg 100  mL/hr over 30 Minutes Intravenous On call to O.R. 12/20/16 2218 12/21/16 1610    .  She was fitted with AV 1 compression foot pump devices, instructed on heel pumps, early ambulation, and fitted with TED stockings bilaterally for DVT prophylaxis.  She benefited maximally from the hospital stay and there were no complications.    Recent vital signs:  Vitals:   12/21/16 1819 12/22/16 0000  BP: 124/64 133/62  Pulse: 80 66  Resp:  18  Temp: 97.6 F (36.4 C) 98.7 F (37.1 C)  SpO2: 100% 98%    Recent laboratory studies:  Lab Results  Component Value Date   HGB 11.6 (L) 12/22/2016   HGB 14.0 12/09/2016   Lab Results  Component Value Date   WBC 13.5 (H) 12/22/2016   PLT 275 12/22/2016   Lab Results  Component Value Date   INR 0.91 12/09/2016   Lab Results  Component Value Date   NA 138 12/22/2016   K 4.1 12/22/2016   CL 108 12/22/2016   CO2 24 12/22/2016   BUN 12 12/22/2016   CREATININE 0.58 12/22/2016   GLUCOSE 118 (H) 12/22/2016    Discharge Medications:   Allergies as of 12/23/2016      Reactions   Penicillins Anaphylaxis   Has patient had a PCN reaction causing immediate rash, facial/tongue/throat swelling, SOB or lightheadedness with hypotension: Yes Has patient had a PCN reaction causing severe rash involving mucus membranes or skin necrosis: No Has patient had a PCN reaction that required hospitalization: No Has patient had a PCN reaction occurring within the last 10 years: No If all of the above answers are "NO", then may proceed with Cephalosporin use.   Sulfa Antibiotics Anaphylaxis, Itching, Swelling   Latex Rash      Medication List    STOP taking these medications   aspirin EC 81 MG tablet   FISH OIL PO   ibuprofen 200 MG tablet Commonly known as:  ADVIL,MOTRIN   naproxen sodium 220 MG tablet Commonly known as:  ANAPROX     TAKE these medications   acetaminophen 500 MG tablet Commonly known as:  TYLENOL Take 1,000 mg by mouth every 6  (six) hours as needed.   enoxaparin 30 MG/0.3ML injection Commonly known as:  LOVENOX Inject 0.4 mLs (40 mg total) into the skin daily.   multivitamin with minerals Tabs tablet Take 1 tablet by mouth daily.   oxyCODONE 5 MG immediate release tablet Commonly known as:  Oxy IR/ROXICODONE Take 1-2 tablets (5-10 mg total) by mouth every 4 (four) hours as needed for severe pain or breakthrough pain.   traMADol 50 MG tablet Commonly known as:  ULTRAM Take 1-2 tablets (50-100 mg total) by mouth every 4 (four) hours as needed for moderate pain.   Vitamin D3 5000 units Caps Take 1 tablet by mouth daily.   vitamin E 400 UNIT capsule Take 1 capsule by mouth daily. 400 IU 1 capsule a day in am.            Durable Medical Equipment        Start     Ordered   12/21/16 1642  DME Walker rolling  Once    Question:  Patient needs a walker to treat with the following condition  Answer:  Total knee replacement status   12/21/16 1641  12/21/16 1642  DME Bedside commode  Once    Question:  Patient needs a bedside commode to treat with the following condition  Answer:  Total knee replacement status   12/21/16 1641      Diagnostic Studies: Dg Knee Right Port  Result Date: 12/21/2016 CLINICAL DATA:  Right total knee replacement. EXAM: PORTABLE RIGHT KNEE - 1-2 VIEW COMPARISON:  None. FINDINGS: The components of the total knee prosthesis appear in excellent position. No fractures. Soft tissue drains in place. IMPRESSION: Satisfactory appearance of the right knee after total knee replacement. Electronically Signed   By: Francene Boyers M.D.   On: 12/21/2016 15:46    Disposition: 01-Home or Self Care    Follow-up Information    Tera Partridge, PA On 01/05/2017.   Specialty:  Physician Assistant Why:  at 9:45am Contact information: [address] [facility] [place] [address] [phone]        Donato Heinz, MD On 02/02/2017.   Specialty:  Orthopedic  Surgery Why:  at 10:00am Contact information: [address] [facility] [place] [address] [phone]            Signed: Tera Partridge 12/22/2016, 7:37 AM

## 2016-12-22 NOTE — Discharge Instructions (Signed)

## 2016-12-22 NOTE — Progress Notes (Signed)
Patient appears to have slept well with no signs of distress. Pain controlled with current medications. VSS

## 2016-12-22 NOTE — Evaluation (Signed)
Physical Therapy Evaluation Patient Details Name: Tyler AasSonja K Ohlinger MRN: [id number] DOB: 10/17/1955 Today's Date: 12/22/2016   History of Present Illness  Pt is a 61 y.o. F s/p R TKA 12/21/2016. PMH: GERD, anxiety, L plantar fascia surgery (2002).  Clinical Impression  Prior to admission pt reports independence with ADLs and ambulation/functional mobility. Pt reports 24 hour/day assistance upon discharge. Pt reports 3/10 R knee pain beginning of session and during activity; 4/10 R knee pain end of session in bedside chair. Pt A and O x4 throughout session. Pt currently modified independent with bed mobility; pt requires CGA and RW for sit to stands and in-room ambulation. Pt will continue to benefit from skilled PT to address ROM, strength, ambulation and functional mobility. Recommended discharge to home with HHPT.    Follow Up Recommendations Home health PT    Equipment Recommendations  Rolling walker with 5" wheels    Recommendations for Other Services       Precautions / Restrictions Precautions Precautions: Knee;Fall Precaution Booklet Issued: Yes (comment) Required Braces or Orthoses:  (able to perform 10 SLR - no knee immobilizer) Restrictions Weight Bearing Restrictions: Yes RLE Weight Bearing: Weight bearing as tolerated      Mobility  Bed Mobility Overal bed mobility: Modified Independent             General bed mobility comments: pt able to perform supine to sit with increased time/effort noted  Transfers Overall transfer level: Needs assistance Equipment used: Rolling walker (2 wheeled) Transfers: Sit to/from Stand Sit to Stand: Min guard         General transfer comment: pt able to perform sit to stand with CGA and RW for safety; vc's for proper B LE and B UE placement for sit to stand  Ambulation/Gait Ambulation/Gait assistance: Min guard Ambulation Distance (Feet): 40 Feet Assistive device: Rolling walker (2 wheeled)   Gait velocity: decreased    General Gait Details: pt able to ambulate 40 ft with step to gait pattern from EOB to door and back to bedside chair with CGA and RW for safety; vc's for proper R LE heel strike  Stairs            Wheelchair Mobility    Modified Rankin (Stroke Patients Only)       Balance Overall balance assessment: Needs assistance Sitting-balance support: No upper extremity supported;Feet supported Sitting balance-Leahy Scale: Good Sitting balance - Comments: pt able to perform sitting balance with no UE support and reaching inside base of support with no loss of balance noted   Standing balance support: Bilateral upper extremity supported;During functional activity Standing balance-Leahy Scale: Fair Standing balance comment: pt able to briefly remove B UE to adjust gown prior to ambulation with no overt loss of balance noted                             Pertinent Vitals/Pain Pain Assessment: 0-10 Pain Score: 4  Pain Location: pt reports 3/10 R knee pain beginning of session and during activity; 4/10 R knee pain end of session in bedside chair Pain Descriptors / Indicators: Aching;Discomfort;Operative site guarding;Tender Pain Intervention(s): Limited activity within patient's tolerance;Monitored during session;Premedicated before session;Repositioned;Ice applied HR - 66 to 88 bpm throughout session via pulse ox O2 - 96 to 98% on RA throughout session via pulse ox    Home Living Family/patient expects to be discharged to:: Private residence Living Arrangements: Spouse/significant other;Children;Parent (with her mother for 2 weeks  after discharge) Available Help at Discharge: Family;Available 24 hours/day Type of Home: House Home Access: Ramped entrance     Home Layout: One level Home Equipment: Walker - 2 wheels;Cane - single point;Bedside commode;Shower seat      Prior Function Level of Independence: Independent         Comments: pt reports independence with ADLs  and functional mobility     Hand Dominance        Extremity/Trunk Assessment   Upper Extremity Assessment Upper Extremity Assessment: Overall WFL for tasks assessed    Lower Extremity Assessment Lower Extremity Assessment: RLE deficits/detail RLE Deficits / Details: at least grossly 3/5 for R ankle DF, R knee flexion/extension, R hip flexion/abduction RLE: Unable to fully assess due to pain       Communication   Communication: No difficulties  Cognition Arousal/Alertness: Awake/alert Behavior During Therapy: WFL for tasks assessed/performed Overall Cognitive Status: Within Functional Limits for tasks assessed                                        General Comments General comments (skin integrity, edema, etc.): Hemovac in place    Exercises Total Joint Exercises Ankle Circles/Pumps: AROM;Both;10 reps;Supine Quad Sets: AROM;Strengthening;Both;10 reps;Supine Short Arc Quad: AROM;Strengthening;Right;10 reps;Supine Heel Slides: AAROM;Right;10 reps;Supine Hip ABduction/ADduction: AROM;Strengthening;Right;10 reps;Supine Straight Leg Raises: AROM;Strengthening;Right;10 reps;Supine Long Arc Quad: AROM;Strengthening;Right;10 reps;Supine Knee Flexion: AROM;Right;10 reps;Seated Goniometric ROM: pt lacking 3 degrees R knee extension in supine beginning of session; 74 degrees R knee flexion end of session in bedside chair Marching in Standing: AROM;Both;10 reps;Standing   Assessment/Plan    PT Assessment Patient needs continued PT services  PT Problem List Decreased strength;Decreased range of motion;Decreased activity tolerance;Decreased balance;Decreased mobility;Decreased knowledge of use of DME;Decreased knowledge of precautions;Pain       PT Treatment Interventions DME instruction;Gait training;Stair training;Functional mobility training;Therapeutic activities;Therapeutic exercise;Balance training;Patient/family education    PT Goals (Current goals can be  found in the Care Plan section)  Acute Rehab PT Goals Patient Stated Goal: to return home PT Goal Formulation: With patient Time For Goal Achievement: 01/05/17 Potential to Achieve Goals: Good    Frequency BID   Barriers to discharge        Co-evaluation               AM-PAC PT "6 Clicks" Daily Activity  Outcome Measure Difficulty turning over in bed (including adjusting bedclothes, sheets and blankets)?: None Difficulty moving from lying on back to sitting on the side of the bed? : A Little Difficulty sitting down on and standing up from a chair with arms (e.g., wheelchair, bedside commode, etc,.)?: A Little Help needed moving to and from a bed to chair (including a wheelchair)?: A Little Help needed walking in hospital room?: A Little Help needed climbing 3-5 steps with a railing? : A Little 6 Click Score: 19    End of Session Equipment Utilized During Treatment: Gait belt Activity Tolerance: Patient tolerated treatment well Patient left: in chair;with call bell/phone within reach;with chair alarm set;with family/visitor present;with SCD's reapplied (B heels elevated via towel rolls; SCD's in place and activated; polar care in place and activated) Nurse Communication: Mobility status;Weight bearing status PT Visit Diagnosis: Other abnormalities of gait and mobility (R26.89);Muscle weakness (generalized) (M62.81);Pain Pain - Right/Left: Right Pain - part of body: Knee    Time: 1610-9604 PT Time Calculation (min) (ACUTE ONLY): 41 min  Charges:         PT G CodesGwenlyn Found, SPT 01-18-2017,10:57 AM [phone]

## 2016-12-22 NOTE — Anesthesia Postprocedure Evaluation (Signed)
Anesthesia Post Note  Patient: Jillian Nelson  Procedure(s) Performed: Procedure(s) (LRB): COMPUTER ASSISTED TOTAL KNEE ARTHROPLASTY (Right)  Patient location during evaluation: Other Anesthesia Type: Spinal Level of consciousness: oriented and awake and alert Pain management: pain level controlled Vital Signs Assessment: post-procedure vital signs reviewed and stable Respiratory status: spontaneous breathing, respiratory function stable and patient connected to nasal cannula oxygen Cardiovascular status: blood pressure returned to baseline and stable Postop Assessment: no headache and no backache Anesthetic complications: no     Last Vitals:  Vitals:   12/22/16 0000 12/22/16 0836  BP: 133/62 (!) 121/54  Pulse: 66 99  Resp: 18 18  Temp: 37.1 C 36.9 C  SpO2: 98% 96%    Last Pain:  Vitals:   12/22/16 0836  TempSrc: Oral  PainSc:                  Starling Mannsurtis,  Philander Ake A

## 2016-12-22 NOTE — Care Management (Signed)
Met with patient to discuss home health services and discharge planning. She plans to go to outpatient PT at [facility]. She plans to go to her mother's address for a couple of weeks at discharge and states her mother can provide transportation. She states she has a front wheeled walker to use. Lovenox 55m #14 called in to [facility] ([phone] Message sent to Dr. HMarry Guanto update on patient request.

## 2016-12-23 LAB — CBC
HCT: 33.2 % — ABNORMAL LOW (ref 35.0–47.0)
HEMOGLOBIN: 11.5 g/dL — AB (ref 12.0–16.0)
MCH: 32.3 pg (ref 26.0–34.0)
MCHC: 34.6 g/dL (ref 32.0–36.0)
MCV: 93.5 fL (ref 80.0–100.0)
Platelets: 254 10*3/uL (ref 150–440)
RBC: 3.56 MIL/uL — AB (ref 3.80–5.20)
RDW: 13.3 % (ref 11.5–14.5)
WBC: 10.9 10*3/uL (ref 3.6–11.0)

## 2016-12-23 LAB — BASIC METABOLIC PANEL
Anion gap: 6 (ref 5–15)
BUN: 12 mg/dL (ref 6–20)
CHLORIDE: 103 mmol/L (ref 101–111)
CO2: 28 mmol/L (ref 22–32)
CREATININE: 0.67 mg/dL (ref 0.44–1.00)
Calcium: 8.3 mg/dL — ABNORMAL LOW (ref 8.9–10.3)
GFR calc non Af Amer: >60 mL/min (ref >60)
Glucose, Bld: 113 mg/dL — ABNORMAL HIGH (ref 65–99)
Potassium: 3.4 mmol/L — ABNORMAL LOW (ref 3.5–5.1)
SODIUM: 137 mmol/L (ref 135–145)

## 2016-12-23 MED ORDER — TRAMADOL HCL 50 MG PO TABS: 50.0000 mg | ORAL_TABLET | ORAL | 0 refills | Status: DC | PRN

## 2016-12-23 MED ORDER — OXYCODONE HCL 5 MG PO TABS: 5.0000 mg | ORAL_TABLET | ORAL | 0 refills | Status: DC | PRN

## 2016-12-23 MED ORDER — ENOXAPARIN SODIUM 30 MG/0.3ML ~~LOC~~ SOLN: 40.0000 mg | SUBCUTANEOUS | 0 refills | Status: DC

## 2016-12-23 MED ORDER — LACTULOSE 10 GM/15ML PO SOLN: 10.0000 g | Freq: Two times a day (BID) | ORAL | Status: DC | PRN

## 2016-12-23 NOTE — Progress Notes (Signed)
Physical Therapy Treatment Patient Details Name: Jillian Nelson MRN: [id number] DOB: [birth year] Today's Date: 12/23/2016    History of Present Illness Pt is a 61 y.o. F s/p R TKA 12/21/2016. PMH: GERD, anxiety, L plantar fascia surgery (2002).    PT Comments    Pt able to ambulate nursing loop with CGA and RW and perform stair training with CGA and use of B railing during this session. Pt reports 4/10 R knee pain beginning and end of session; 5/10 R knee pain reported with activity (pre-medicated). Pt continues to require initial verbal cues for proper form with R LE heel strike; pt also requires vc's and visual demonstration for proper form ascending/descending stairs (pt does not have stairs to ascend/descend at home). Next session focus on activity tolerance.    Follow Up Recommendations  Home health PT     Equipment Recommendations  Rolling walker with 5" wheels    Recommendations for Other Services       Precautions / Restrictions Precautions Precautions: Knee;Fall Precaution Booklet Issued: Yes (comment) Required Braces or Orthoses:  (able to perform 10 SLR - no knee immobilizer needed) Restrictions Weight Bearing Restrictions: Yes RLE Weight Bearing: Weight bearing as tolerated    Mobility  Bed Mobility Overal bed mobility: Modified Independent             General bed mobility comments: pt able to perform sit to supine and supine to sit with modified independence  Transfers Overall transfer level: Needs assistance Equipment used: Rolling walker (2 wheeled) Transfers: Sit to/from Stand Sit to Stand: Min guard         General transfer comment: pt demonstrates proper technique with sit to stands with CGA and RW for safety  Ambulation/Gait Ambulation/Gait assistance: Min guard Ambulation Distance (Feet):  (100+200) Assistive device: Rolling walker (2 wheeled)   Gait velocity: decreased   General Gait Details: pt able to ambulate 100 ft with CGA and RW to  gym to perform stair training; pt able to ambulate 200 ft with CGA and RW around nursing loop and back to EOB; pt requires initial vc's for proper form for R LE heel strike   Stairs Stairs: Yes   Stair Management: Two rails Number of Stairs: 4 General stair comments: pt able to ascend/descend stairs with use of B railing with CGA for safety; verbal cues and visual demonstration for proper form for ascending/descending stairs  Wheelchair Mobility    Modified Rankin (Stroke Patients Only)       Balance Overall balance assessment: Needs assistance Sitting-balance support: No upper extremity supported;Feet supported Sitting balance-Leahy Scale: Good Sitting balance - Comments: pt able to perform sitting balance with no UE support and reaching inside base of support with no loss of balance noted   Standing balance support: Bilateral upper extremity supported;During functional activity Standing balance-Leahy Scale: Fair Standing balance comment: pt able to perform static and dynamic standing balance with CGA and RW with no overt loss of balance noted                            Cognition Arousal/Alertness: Awake/alert Behavior During Therapy: WFL for tasks assessed/performed Overall Cognitive Status: Within Functional Limits for tasks assessed                                        Exercises Total Joint Exercises Ankle  Circles/Pumps: AROM;Both;10 reps;Supine Quad Sets: AROM;Strengthening;Both;10 reps;Supine Straight Leg Raises: AROM;Strengthening;Right;10 reps;Supine Long Arc Quad: AROM;Strengthening;Right;10 reps;Seated Knee Flexion: AROM;Right;10 reps;Seated (wash cloth under R foot to assist slide) Goniometric ROM: pt lacking 2 degrees R knee extension in supine beginning of session; 91 degrees R knee flexion end of session on EOB    General Comments        Pertinent Vitals/Pain Pain Assessment: 0-10 Pain Score: 5  Pain Location: pt reports  4/10 R knee pain beginning and end of session; 5/10 R knee pain reported with activity (pre-medicated) Pain Descriptors / Indicators: Aching;Discomfort;Operative site guarding;Tender Pain Intervention(s): Limited activity within patient's tolerance;Monitored during session;Premedicated before session;Repositioned;Ice applied HR - 84 to 107 bpm throughout session via pulse ox O2 - 93 to 99% on RA throughout session via pulse ox    Home Living                      Prior Function            PT Goals (current goals can now be found in the care plan section) Acute Rehab PT Goals Patient Stated Goal: to return home PT Goal Formulation: With patient Time For Goal Achievement: 01/05/17 Potential to Achieve Goals: Good Progress towards PT goals: Progressing toward goals    Frequency    BID      PT Plan Current plan remains appropriate    Co-evaluation              AM-PAC PT "6 Clicks" Daily Activity  Outcome Measure  Difficulty turning over in bed (including adjusting bedclothes, sheets and blankets)?: None Difficulty moving from lying on back to sitting on the side of the bed? : A Little Difficulty sitting down on and standing up from a chair with arms (e.g., wheelchair, bedside commode, etc,.)?: A Little Help needed moving to and from a bed to chair (including a wheelchair)?: A Little Help needed walking in hospital room?: A Little Help needed climbing 3-5 steps with a railing? : A Little 6 Click Score: 19    End of Session Equipment Utilized During Treatment: Gait belt Activity Tolerance: Patient tolerated treatment well Patient left: in bed;with call bell/phone within reach;with bed alarm set;with SCD's reapplied (R heel elevated via bone foam; L heel elevated via towel roll; SCD's in place and activated; polar care in place and activated) Nurse Communication: Mobility status;Weight bearing status PT Visit Diagnosis: Other abnormalities of gait and mobility  (R26.89);Muscle weakness (generalized) (M62.81);Pain Pain - Right/Left: Right Pain - part of body: Knee     Time: [id number] PT Time Calculation (min) (ACUTE ONLY): 26 min  Charges:                       G CodesGwenlyn Found:       Lunna Vogelgesang, SPT 12/23/16,1:40 PM [phone]

## 2016-12-23 NOTE — Care Management Note (Signed)
Case Management Note  Patient Details  Name: Jillian Nelson MRN: [id number] Date of Birth: 12/08/1955  Subjective/Objective:  Discharging today                  [facility]/Plan: [facility] will [facility] and has correct address.   Expected Discharge Date:  12/23/16               Expected Discharge Plan:  Home w Home Health Services  In-House Referral:     Discharge planning Services  CM Consult  Post Acute Care Choice:  Home Health Choice offered to:  Patient, Spouse  DME Arranged:    DME Agency:     HH Arranged:  PT HH Agency:  [facility]  Status of Service:  Completed, signed off  If discussed at Long Length of Stay Meetings, dates discussed:    Additional Comments:  Marily MemosLisa M Jenafer Winterton, RN 12/23/2016, 9:03 AM

## 2016-12-23 NOTE — Progress Notes (Signed)
   Subjective: 2 Days Post-Op Procedure(s) (LRB): COMPUTER ASSISTED TOTAL KNEE ARTHROPLASTY (Right) Patient reports pain as moderate.   Patient is well, and has had no acute complaints or problems Patient did extremely well yesterday with physical therapy. Walked greater than 220 feet.  Plan is to go Home after hospital stay. no nausea and no vomiting Patient denies any chest pains or shortness of breath. Objective: Vital signs in last 24 hours: Temp:  [97.5 F (36.4 C)-98.4 F (36.9 C)] 98.3 F (36.8 C) (08/14 2304) Pulse Rate:  [70-99] 73 (08/14 2304) Resp:  [18] 18 (08/14 2304) BP: (121-136)/(52-54) 128/52 (08/14 2304) SpO2:  [96 %-99 %] 99 % (08/14 2304) well approximated incision Heels are non tender and elevated off the bed using rolled towels Intake/Output from previous day: 08/14 0701 - 08/15 0700 In: 1580 [P.O.:1200; I.V.:380] Out: 500 [Urine:350; Drains:150] Intake/Output this shift: Total I/O In: 480 [P.O.:480] Out: 150 [Drains:150]   Recent Labs  12/22/16 0448 12/23/16 0424  HGB 11.6* 11.5*    Recent Labs  12/22/16 0448 12/23/16 0424  WBC 13.5* 10.9  RBC 3.63* 3.56*  HCT 34.3* 33.2*  PLT 275 254    Recent Labs  12/22/16 0448 12/23/16 0424  NA 138 137  K 4.1 3.4*  CL 108 103  CO2 24 28  BUN 12 12  CREATININE 0.58 0.67  GLUCOSE 118* 113*  CALCIUM 8.6* 8.3*   No results for input(s): LABPT, INR in the last 72 hours.  EXAM General - Patient is Alert, Appropriate and Oriented Extremity - Neurologically intact Neurovascular intact Sensation intact distally Intact pulses distally Dorsiflexion/Plantar flexion intact No cellulitis present Compartment soft Dressing - dressing C/D/I Motor Function - intact, moving foot and toes well on exam.    Past Medical History:  Diagnosis Date  . Anxiety   . Arthritis   . Family history of adverse reaction to anesthesia    mom is hard to wake up after anesthesia.  Marland Kitchen. GERD (gastroesophageal reflux  disease)     Assessment/Plan: 2 Days Post-Op Procedure(s) (LRB): COMPUTER ASSISTED TOTAL KNEE ARTHROPLASTY (Right) Active Problems:   S/P total knee arthroplasty  Estimated body mass index is 29.63 kg/m as calculated from the following:   Height as of this encounter: 5\' 2"  (1.575 m).   Weight as of this encounter: 73.5 kg (161 lb 15.9 oz). Up with therapy Discharge home with home health  Labs: Were reviewed. Hemoglobin 11.5 DVT Prophylaxis - Lovenox, Foot Pumps and TED hose Weight-Bearing as tolerated to right leg Hemovac discontinued on today's visit Lactulose ordered. Patient needs a bowel movement today. Patient should be able to go home once she has a bowel movement. Pleased wash the operative leg and apply TED stockings. Please change dressing prior to patient being discharged and give her two extra ones to take home  Cletis AthensJon R. Le[facility] CenterWolfe PA [facility] 12/23/2016, 6:57 AM

## 2016-12-23 NOTE — Progress Notes (Signed)
Jillian Nelson to be D/C'd Home per MD order.  Discussed with the patient and all questions fully answered.  VSS, Skin clean, dry and intact without evidence of skin break down, no evidence of skin tears noted. IV catheter discontinued intact. Site without signs and symptoms of complications. Dressing and pressure applied.  An After Visit Summary was printed and given to the patient. Patient received prescription.  D/c education completed with patient/family including follow up instructions, medication list, d/c activities limitations if indicated, with other d/c instructions as indicated by MD - patient able to verbalize understanding, all questions fully answered.   Patient instructed to return to ED, call 911, or call MD for any changes in condition.   Patient escorted via WC, and D/C home via private auto.  Harvie HeckMelanie Blaze Sandin 12/23/2016 3:53 PM

## 2016-12-23 NOTE — Progress Notes (Signed)
RN resumed care at 1500. Patient awaiting discharge. Vitals stable.   Harvie HeckMelanie Sakira Dahmer, RN

## 2016-12-30 ENCOUNTER — Encounter: Payer: Self-pay | Admitting: Orthopedic Surgery

## 2017-03-24 ENCOUNTER — Other Ambulatory Visit: Payer: Self-pay

## 2017-03-24 ENCOUNTER — Encounter
Admission: RE | Admit: 2017-03-24 | Discharge: 2017-03-24 | Disposition: A | Discharge status: Home / Self Care | Payer: BLUE CROSS/BLUE SHIELD | Source: Ambulatory Visit | Attending: Orthopedic Surgery | Admitting: Orthopedic Surgery

## 2017-03-24 NOTE — Patient Instructions (Signed)
  Your procedure is scheduled on: 04-05-17  Report to Same Day Surgery 2nd floor medical mall [facility](Medical Mall Entrance-take elevator on left to 2nd floor.  Check in with surgery information desk.) To find out your arrival time please call [phone] between 1PM - 3PM on 04-02-17  Remember: Instructions that are not followed completely may result in serious medical risk, up to and including death, or upon the discretion of your surgeon and anesthesiologist your surgery may need to be rescheduled.    _x___ 1. Do not eat food after midnight the night before your procedure. You may drink clear liquids up to 2 hours before you are scheduled to arrive at the hospital for your procedure.  Do not drink clear liquids within 2 hours of your scheduled arrival to the hospital.  Clear liquids include  --Water or Apple juice without pulp  --Clear carbohydrate beverage such as ClearFast or Gatorade  --Black Coffee or Clear Tea (No milk, no creamers, do not add anything to  the coffee or Tea   No gum chewing or hard candies.     __x__ 2. No Alcohol for 24 hours before or after surgery.   __x__3. No Smoking for 24 prior to surgery.   ____  4. Bring all medications with you on the day of surgery if instructed.    __x__ 5. Notify your doctor if there is any change in your medical condition     (cold, fever, infections).     Do not wear jewelry, make-up, hairpins, clips or nail polish.  Do not wear lotions, powders, or perfumes. You may wear deodorant.  Do not shave 48 hours prior to surgery. Men may shave face and neck.  Do not bring valuables to the hospital.    [facility] for any belongings or valuables.               Contacts, dentures or bridgework may not be worn into surgery.  Leave your suitcase in the car. After surgery it may be brought to your room.  For patients admitted to the hospital, discharge time is determined by your  treatment team.   Patients discharged the  day of surgery will not be allowed to drive home.  You will need someone to drive you home and stay with you the night of your procedure.    ____ Take anti-hypertensive listed below, cardiac, seizure, asthma,     anti-reflux and psychiatric medicines. These include:  1. NONE  2.  3.  4.  5.  6.  ____Fleets enema or Magnesium Citrate as directed.   ____ Use CHG Soap or sage wipes as directed on instruction sheet   ____ Use inhalers on the day of surgery and bring to hospital day of surgery  ____ Stop Metformin and Janumet 2 days prior to surgery.    ____ Take 1/2 of usual insulin dose the night before surgery and none on the morning surgery.   ____ Follow recommendations from Cardiologist, Pulmonologist or PCP regarding stopping Aspirin, Coumadin, Plavix ,Eliquis, Effient, or Pradaxa, and Pletal.  X____Stop Anti-inflammatories such as Advil, ALEVE, Ibuprofen, Motrin, Naproxen, Naprosyn, Goodies powders or aspirin products 7 DAYS PRIOR TO SURGERY-OK to take Tylenol    _x___ Stop supplements until after surgery-STOP VITAMIN E AND FISH OIL 7 DAYS PRIOR TO SURGERY   ____ Bring C-Pap to the hospital.

## 2017-04-05 ENCOUNTER — Encounter: Payer: Self-pay | Admitting: Orthopedic Surgery

## 2017-04-05 ENCOUNTER — Ambulatory Visit: Payer: BLUE CROSS/BLUE SHIELD | Admitting: Certified Registered Nurse Anesthetist

## 2017-04-05 ENCOUNTER — Ambulatory Visit
Admission: RE | Admit: 2017-04-05 | Discharge: 2017-04-05 | Disposition: A | Discharge status: Home / Self Care | Payer: BLUE CROSS/BLUE SHIELD | Source: Ambulatory Visit | Attending: Orthopedic Surgery | Admitting: Orthopedic Surgery

## 2017-04-05 ENCOUNTER — Other Ambulatory Visit: Payer: Self-pay

## 2017-04-05 ENCOUNTER — Encounter
Admission: RE | Disposition: A | Discharge status: Home / Self Care | Payer: Self-pay | Source: Ambulatory Visit | Attending: Orthopedic Surgery

## 2017-04-05 DIAGNOSIS — Z87891 Personal history of nicotine dependence: Secondary | ICD-10-CM | POA: Diagnosis not present

## 2017-04-05 DIAGNOSIS — Z96651 Presence of right artificial knee joint: Secondary | ICD-10-CM | POA: Insufficient documentation

## 2017-04-05 DIAGNOSIS — Z79899 Other long term (current) drug therapy: Secondary | ICD-10-CM | POA: Diagnosis not present

## 2017-04-05 DIAGNOSIS — F419 Anxiety disorder, unspecified: Secondary | ICD-10-CM | POA: Diagnosis not present

## 2017-04-05 DIAGNOSIS — M24661 Ankylosis, right knee: Secondary | ICD-10-CM | POA: Diagnosis present

## 2017-04-05 DIAGNOSIS — K219 Gastro-esophageal reflux disease without esophagitis: Secondary | ICD-10-CM | POA: Diagnosis not present

## 2017-04-05 HISTORY — PX: KNEE CLOSED REDUCTION: SHX995

## 2017-04-05 SURGERY — MANIPULATION, KNEE, CLOSED
Anesthesia: General | Site: Knee | Laterality: Right

## 2017-04-05 MED ORDER — SUCCINYLCHOLINE CHLORIDE 20 MG/ML IJ SOLN
INTRAMUSCULAR | Status: DC | PRN
  Administered 2017-04-05: 20 mg via INTRAVENOUS

## 2017-04-05 MED ORDER — FENTANYL CITRATE (PF) 100 MCG/2ML IJ SOLN
INTRAMUSCULAR | Status: AC
  Filled 2017-04-05: qty 2

## 2017-04-05 MED ORDER — METOCLOPRAMIDE HCL 10 MG PO TABS: 5.0000 mg | ORAL_TABLET | Freq: Three times a day (TID) | ORAL | Status: DC | PRN

## 2017-04-05 MED ORDER — ACETAMINOPHEN 10 MG/ML IV SOLN
1000.0000 mg | Freq: Once | INTRAVENOUS | Status: AC
  Administered 2017-04-05: 1000 mg via INTRAVENOUS

## 2017-04-05 MED ORDER — MIDAZOLAM HCL 2 MG/2ML IJ SOLN
INTRAMUSCULAR | Status: DC | PRN
  Administered 2017-04-05: 2 mg via INTRAVENOUS

## 2017-04-05 MED ORDER — LACTATED RINGERS IV SOLN
INTRAVENOUS | Status: DC
  Administered 2017-04-05: 14:00:00 via INTRAVENOUS

## 2017-04-05 MED ORDER — ACETAMINOPHEN 10 MG/ML IV SOLN
INTRAVENOUS | Status: AC
  Filled 2017-04-05: qty 100

## 2017-04-05 MED ORDER — CHLORHEXIDINE GLUCONATE 4 % EX LIQD: 60.0000 mL | Freq: Once | CUTANEOUS | Status: DC

## 2017-04-05 MED ORDER — FENTANYL CITRATE (PF) 100 MCG/2ML IJ SOLN
25.0000 ug | INTRAMUSCULAR | Status: DC | PRN
  Administered 2017-04-05 (×4): 25 ug via INTRAVENOUS

## 2017-04-05 MED ORDER — HYDROCODONE-ACETAMINOPHEN 5-325 MG PO TABS: 1.0000 | ORAL_TABLET | ORAL | 0 refills | Status: DC | PRN

## 2017-04-05 MED ORDER — FAMOTIDINE 20 MG PO TABS
ORAL_TABLET | ORAL | Status: AC
  Filled 2017-04-05: qty 1

## 2017-04-05 MED ORDER — HYDROCODONE-ACETAMINOPHEN 5-325 MG PO TABS: 1.0000 | ORAL_TABLET | ORAL | Status: DC | PRN

## 2017-04-05 MED ORDER — ONDANSETRON HCL 4 MG/2ML IJ SOLN: 4.0000 mg | Freq: Four times a day (QID) | INTRAMUSCULAR | Status: DC | PRN

## 2017-04-05 MED ORDER — ONDANSETRON HCL 4 MG PO TABS: 4.0000 mg | ORAL_TABLET | Freq: Four times a day (QID) | ORAL | Status: DC | PRN

## 2017-04-05 MED ORDER — FAMOTIDINE 20 MG PO TABS
20.0000 mg | ORAL_TABLET | Freq: Once | ORAL | Status: AC
  Administered 2017-04-05: 20 mg via ORAL

## 2017-04-05 MED ORDER — ONDANSETRON HCL 4 MG/2ML IJ SOLN: 4.0000 mg | Freq: Once | INTRAMUSCULAR | Status: DC | PRN

## 2017-04-05 MED ORDER — MIDAZOLAM HCL 2 MG/2ML IJ SOLN
INTRAMUSCULAR | Status: AC
  Filled 2017-04-05: qty 2

## 2017-04-05 MED ORDER — METOCLOPRAMIDE HCL 5 MG/ML IJ SOLN: 5.0000 mg | Freq: Three times a day (TID) | INTRAMUSCULAR | Status: DC | PRN

## 2017-04-05 MED ORDER — PROPOFOL 500 MG/50ML IV EMUL
INTRAVENOUS | Status: DC | PRN
  Administered 2017-04-05: 50 ug/kg/min via INTRAVENOUS

## 2017-04-05 SURGICAL SUPPLY — 1 items: KIT RM TURNOVER STRD PROC AR (KITS) ×3 IMPLANT

## 2017-04-05 NOTE — H&P (Signed)
The patient has been re-examined, and the chart reviewed, and there have been no interval changes to the documented history and physical.    The risks, benefits, and alternatives have been discussed at length. The patient expressed understanding of the risks benefits and agreed with plans for surgical intervention.  James P. Hooten, Jr. M.D.    

## 2017-04-05 NOTE — Transfer of Care (Signed)
Immediate Anesthesia Transfer of Care Note  Patient: Jillian Nelson  Procedure(s) Performed: CLOSED MANIPULATION KNEE (Right Knee)  Patient Location: PACU  Anesthesia Type:General  Level of Consciousness: awake, alert  and oriented  Airway & Oxygen Therapy: Patient Spontanous Breathing and Patient connected to nasal cannula oxygen  Post-op Assessment: Report given to RN and Post -op Vital signs reviewed and stable  Post vital signs: Reviewed and stable  Last Vitals:  Vitals:   04/05/17 1343 04/05/17 1515  BP: (!) 149/76 (!) 129/95  Pulse: 73 88  Resp: 18 18  Temp: 36.7 C 37.1 C  SpO2: 98% 100%    Last Pain:  Vitals:   04/05/17 1343  TempSrc: Temporal  PainSc: 2          Complications: No apparent anesthesia complications

## 2017-04-05 NOTE — Anesthesia Postprocedure Evaluation (Signed)
Anesthesia Post Note  Patient: Jillian Nelson  Procedure(s) Performed: CLOSED MANIPULATION KNEE (Right Knee)  Patient location during evaluation: PACU Anesthesia Type: General Level of consciousness: awake and alert and oriented Pain management: pain level controlled Vital Signs Assessment: post-procedure vital signs reviewed and stable Respiratory status: spontaneous breathing Cardiovascular status: blood pressure returned to baseline Anesthetic complications: no     Last Vitals:  Vitals:   04/05/17 1515 04/05/17 1530  BP: (!) 129/95 135/70  Pulse: 88 79  Resp: 18 16  Temp: 37.1 C   SpO2: 100% 99%    Last Pain:  Vitals:   04/05/17 1535  TempSrc:   PainSc: 5                  Eldra Word

## 2017-04-05 NOTE — Op Note (Signed)
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   ASSISTANT: None  ANESTHESIA: general  ESTIMATED BLOOD LOSS: None  FLUIDS REPLACED: 200 mL of crystalloid  INDICATIONS FOR SURGERY: Jillian Nelson is a 61 y.o. year old female who previously underwent right total knee arthroplasty.  Despite aggressive physical therapy, the patient was noted to have limited flexion consistent with arthrofibrosis. After discussion of the risks and benefits of surgical intervention, the patient expressed understanding of the risks benefits and agree with plans for manipulation of the right knee under anesthesia.   PROCEDURE IN DETAIL: The patient was brought into the operating room and, after adequate general anesthesia was achieved, the knee was flexed and measured prior to manipulation.  The pre-manipulation in flexion was 95 degrees.  I placed my year against the patient's knee in my shoulder against the pretibial area and then gently flex the knee.  There was audible lysis of adhesions appreciated.  Post manipulation range of motion with a goniometer was 127 degrees.  The patient tolerated procedure well.  She was transported to the recovery room in stable condition.   Cayli Escajeda P. Angie FavaHooten, Jr. M.D.

## 2017-04-05 NOTE — Discharge Instructions (Signed)

## 2017-04-05 NOTE — Anesthesia Post-op Follow-up Note (Signed)
Anesthesia QCDR form completed.        

## 2017-04-05 NOTE — Anesthesia Preprocedure Evaluation (Signed)
Anesthesia Evaluation  Patient identified by MRN, date of birth, ID band Patient awake    Reviewed: Allergy & Precautions, NPO status , Patient's Chart, lab work & pertinent test results  History of Anesthesia Complications (+) Family history of anesthesia reaction  Airway Mallampati: III  TM Distance: <3 FB     Dental  (+) Caps, Chipped   Pulmonary former smoker,    Pulmonary exam normal        Cardiovascular negative cardio ROS Normal cardiovascular exam     Neuro/Psych Anxiety negative neurological ROS     GI/Hepatic Neg liver ROS, GERD  Medicated,  Endo/Other  negative endocrine ROS  Renal/GU negative Renal ROS  negative genitourinary   Musculoskeletal  (+) Arthritis , Osteoarthritis,    Abdominal Normal abdominal exam  (+)   Peds negative pediatric ROS (+)  Hematology negative hematology ROS (+)   Anesthesia Other Findings   Reproductive/Obstetrics                             Anesthesia Physical  Anesthesia Plan  ASA: III  Anesthesia Plan: General   Post-op Pain Management:    Induction: Intravenous  PONV Risk Score and Plan:   Airway Management Planned: Mask  Additional Equipment:   Intra-op Plan:   Post-operative Plan:   Informed Consent: I have reviewed the patients History and Physical, chart, labs and discussed the procedure including the risks, benefits and alternatives for the proposed anesthesia with the patient or authorized representative who has indicated his/her understanding and acceptance.   Dental advisory given  Plan Discussed with: CRNA and Surgeon  Anesthesia Plan Comments:         Anesthesia Quick Evaluation

## 2017-04-06 ENCOUNTER — Encounter: Payer: Self-pay | Admitting: Orthopedic Surgery

## 2018-06-06 ENCOUNTER — Other Ambulatory Visit: Payer: Self-pay | Admitting: Nurse Practitioner

## 2018-06-06 DIAGNOSIS — Z1231 Encounter for screening mammogram for malignant neoplasm of breast: Secondary | ICD-10-CM

## 2020-05-24 ENCOUNTER — Other Ambulatory Visit: Payer: Self-pay | Admitting: Family Medicine

## 2020-05-24 DIAGNOSIS — Z1231 Encounter for screening mammogram for malignant neoplasm of breast: Secondary | ICD-10-CM

## 2021-08-13 ENCOUNTER — Other Ambulatory Visit: Payer: Self-pay | Admitting: Family Medicine

## 2021-08-13 DIAGNOSIS — Z1231 Encounter for screening mammogram for malignant neoplasm of breast: Secondary | ICD-10-CM

## 2021-09-18 ENCOUNTER — Ambulatory Visit
Admission: RE | Admit: 2021-09-18 | Discharge: 2021-09-18 | Disposition: A | Discharge status: Home / Self Care | Payer: BC Managed Care – PPO | Source: Ambulatory Visit | Attending: Family Medicine | Admitting: Family Medicine

## 2021-09-18 DIAGNOSIS — Z1231 Encounter for screening mammogram for malignant neoplasm of breast: Secondary | ICD-10-CM

## 2022-07-06 ENCOUNTER — Encounter: Payer: Self-pay | Admitting: Emergency Medicine

## 2022-07-06 ENCOUNTER — Other Ambulatory Visit: Payer: Self-pay

## 2022-07-06 ENCOUNTER — Emergency Department: Payer: BC Managed Care – PPO

## 2022-07-06 ENCOUNTER — Observation Stay
Admission: EM | Admit: 2022-07-06 | Discharge: 2022-07-07 | Disposition: A | Discharge status: Home / Self Care | Payer: BC Managed Care – PPO | Attending: Internal Medicine | Admitting: Internal Medicine

## 2022-07-06 DIAGNOSIS — R0789 Other chest pain: Principal | ICD-10-CM | POA: Insufficient documentation

## 2022-07-06 DIAGNOSIS — R9431 Abnormal electrocardiogram [ECG] [EKG]: Secondary | ICD-10-CM | POA: Diagnosis not present

## 2022-07-06 DIAGNOSIS — Z96651 Presence of right artificial knee joint: Secondary | ICD-10-CM | POA: Insufficient documentation

## 2022-07-06 DIAGNOSIS — E669 Obesity, unspecified: Secondary | ICD-10-CM

## 2022-07-06 DIAGNOSIS — Z91040 Latex allergy status: Secondary | ICD-10-CM | POA: Diagnosis not present

## 2022-07-06 DIAGNOSIS — K219 Gastro-esophageal reflux disease without esophagitis: Secondary | ICD-10-CM

## 2022-07-06 DIAGNOSIS — R7309 Other abnormal glucose: Secondary | ICD-10-CM | POA: Insufficient documentation

## 2022-07-06 DIAGNOSIS — Z87891 Personal history of nicotine dependence: Secondary | ICD-10-CM | POA: Diagnosis not present

## 2022-07-06 DIAGNOSIS — R079 Chest pain, unspecified: Secondary | ICD-10-CM | POA: Diagnosis present

## 2022-07-06 DIAGNOSIS — R739 Hyperglycemia, unspecified: Secondary | ICD-10-CM | POA: Insufficient documentation

## 2022-07-06 LAB — BASIC METABOLIC PANEL
Anion gap: 9 (ref 5–15)
BUN: 11 mg/dL (ref 8–23)
CO2: 25 mmol/L (ref 22–32)
Calcium: 8.9 mg/dL (ref 8.9–10.3)
Chloride: 104 mmol/L (ref 98–111)
Creatinine, Ser: 0.5 mg/dL (ref 0.44–1.00)
GFR, Estimated: >60 mL/min (ref >60)
Glucose, Bld: 221 mg/dL — ABNORMAL HIGH (ref 70–99)
Potassium: 4 mmol/L (ref 3.5–5.1)
Sodium: 138 mmol/L (ref 135–145)

## 2022-07-06 LAB — CBC
HCT: 41.9 % (ref 36.0–46.0)
Hemoglobin: 14.1 g/dL (ref 12.0–15.0)
MCH: 31.8 pg (ref 26.0–34.0)
MCHC: 33.7 g/dL (ref 30.0–36.0)
MCV: 94.4 fL (ref 80.0–100.0)
Platelets: 303 10*3/uL (ref 150–400)
RBC: 4.44 MIL/uL (ref 3.87–5.11)
RDW: 12.4 % (ref 11.5–15.5)
WBC: 6.9 10*3/uL (ref 4.0–10.5)
nRBC: 0 % (ref 0.0–0.2)

## 2022-07-06 LAB — CBG MONITORING, ED: Glucose-Capillary: 104 mg/dL — ABNORMAL HIGH (ref 70–99)

## 2022-07-06 LAB — TROPONIN I (HIGH SENSITIVITY)
Troponin I (High Sensitivity): 5 ng/L (ref <18)
Troponin I (High Sensitivity): 5 ng/L (ref <18)

## 2022-07-06 LAB — D-DIMER, QUANTITATIVE: D-Dimer, Quant: 0.46 ug/mL-FEU (ref 0.00–0.50)

## 2022-07-06 MED ORDER — ACETAMINOPHEN 325 MG PO TABS: 650.0000 mg | ORAL_TABLET | ORAL | Status: DC | PRN

## 2022-07-06 MED ORDER — ENOXAPARIN SODIUM 40 MG/0.4ML IJ SOSY: 40.0000 mg | PREFILLED_SYRINGE | INTRAMUSCULAR | Status: DC

## 2022-07-06 MED ORDER — TRAZODONE HCL 50 MG PO TABS: 25.0000 mg | ORAL_TABLET | Freq: Every evening | ORAL | Status: DC | PRN

## 2022-07-06 MED ORDER — INSULIN ASPART 100 UNIT/ML IJ SOLN: 0.0000 [IU] | Freq: Three times a day (TID) | INTRAMUSCULAR | Status: DC

## 2022-07-06 MED ORDER — ONDANSETRON HCL 4 MG/2ML IJ SOLN: 4.0000 mg | Freq: Four times a day (QID) | INTRAMUSCULAR | Status: DC | PRN

## 2022-07-06 MED ORDER — LACTATED RINGERS IV SOLN: INTRAVENOUS | Status: DC

## 2022-07-06 MED ORDER — ASPIRIN 81 MG PO TBEC
81.0000 mg | DELAYED_RELEASE_TABLET | Freq: Every day | ORAL | Status: DC
  Administered 2022-07-06 – 2022-07-07 (×2): 81 mg via ORAL
  Filled 2022-07-06 (×2): qty 1

## 2022-07-06 MED ORDER — ALPRAZOLAM 0.25 MG PO TABS: 0.2500 mg | ORAL_TABLET | Freq: Two times a day (BID) | ORAL | Status: DC | PRN

## 2022-07-06 MED ORDER — MAGNESIUM HYDROXIDE 400 MG/5ML PO SUSP: 30.0000 mL | Freq: Every day | ORAL | Status: DC | PRN

## 2022-07-06 MED ORDER — INSULIN ASPART 100 UNIT/ML IJ SOLN: 0.0000 [IU] | Freq: Every day | INTRAMUSCULAR | Status: DC

## 2022-07-06 NOTE — H&P (Addendum)
[place]   PATIENT NAME: Jillian Nelson    MR#:  [id number]  DATE OF BIRTH:  [birth year]  DATE OF ADMISSION:  07/06/2022  PRIMARY CARE PHYSICIAN: [facility]   Patient is coming from: Home  REQUESTING/REFERRING PHYSICIAN: Ane Payment, MD  CHIEF COMPLAINT:   Chief Complaint  Patient presents with   Chest Pain    HISTORY OF PRESENT ILLNESS:  Jillian Nelson is a 67 y.o. female with medical history significant for anxiety, prediabetes and osteoarthritis as well as GERD, who presented to the emergency room with acute onset of left parasternal and inframammary chest pain that started this morning.  She describes it sharp in nature and graded 6/10 in severity without radiation.  She denies any associated nausea or vomiting or diaphoresis.  She denied any dyspnea or palpitations with the pain. No leg pain or edema or recent travels or surgeries.  Her chest pain has been nonexertional without any specific precipitating factors.  She admits to normal stressors in life without increase lately.  She has been constipated.  She denies any abdominal pain or melena or bright red bleeding per rectum.  She stated that she has hemorrhoids and will sometimes see blood when she wipes though.  She denies any polyuria polydipsia.  No skin infections.  She took 2 extra 325 mg p.o. aspirin at home before coming to the ER.  She was pain-free during my interview.  ED Course: When she came to the ER, BP was 164/92 with heart rate of 103.  Labs revealed a blood glucose of 221 with otherwise unremarkable BMP.  CBC was within normal.  High-sensitivity troponin was 5 twice. EKG as reviewed by me : Sinus tachycardia with a rate of 105 with left axis deviation and LVH with repolarization abnormality and occasional PVCs with minimal ST segment depression inferolaterally..Repeat EKG showed normal sinus rhythm with a rate of 78 with moderate voltage criteria for LVH, poor R wave progression and  T wave inversion inferiorly with mild ST segment depression Imaging: 2 view chest x-ray showed no acute cardiopulmonary disease.  The patient will be admitted to an observation cardiac telemetry bed for further evaluation and management. PAST MEDICAL HISTORY:   Past Medical History:  Diagnosis Date   Anxiety    Arthritis    Family history of adverse reaction to anesthesia    mom is hard to wake up after anesthesia.   GERD (gastroesophageal reflux disease)     PAST SURGICAL HISTORY:   Past Surgical History:  Procedure Laterality Date   BREAST BIOPSY Right    neg   BREAST SURGERY     right   CYST EXCISION Left 1996   arm   JOINT REPLACEMENT     KNEE ARTHROPLASTY Right 12/21/2016   Procedure: COMPUTER ASSISTED TOTAL KNEE ARTHROPLASTY;  Surgeon: Dereck Leep, MD;  Location: [facility] ORS;  Service: Orthopedics;  Laterality: Right;   KNEE CLOSED REDUCTION Right 04/05/2017   Procedure: CLOSED MANIPULATION KNEE;  Surgeon: Dereck Leep, MD;  Location: [facility] ORS;  Service: Orthopedics;  Laterality: Right;   PLANTAR FASCIA SURGERY Left 2002   Right Knee replacement Right 12/21/2016    SOCIAL HISTORY:   Social History   Tobacco Use   Smoking status: Former    Types: Cigarettes    Quit date: 12/09/2004    Years since quitting: 17.5   Smokeless tobacco: Never  Substance Use Topics   Alcohol use: No    FAMILY  HISTORY:   Family History  Problem Relation Age of Onset   Dementia Father    Breast cancer Neg Hx     DRUG ALLERGIES:   Allergies  Allergen Reactions   Penicillins Anaphylaxis and Other (See Comments)    Has patient had a PCN reaction causing immediate rash, facial/tongue/throat swelling, SOB or lightheadedness with hypotension: Yes Has patient had a PCN reaction causing severe rash involving mucus membranes or skin necrosis: No Has patient had a PCN reaction that required hospitalization: No Has patient had a PCN reaction occurring within the last 10 years:  No If all of the above answers are "NO", then may proceed with Cephalosporin use.    Sulfa Antibiotics Anaphylaxis, Itching and Swelling   Latex Rash    LATEX IgE NEGATIVE ON 12-09-16   Oxycodone Rash    REVIEW OF SYSTEMS:   ROS As per history of present illness. All pertinent systems were reviewed above. Constitutional, HEENT, cardiovascular, respiratory, GI, GU, musculoskeletal, neuro, psychiatric, endocrine, integumentary and hematologic systems were reviewed and are otherwise negative/unremarkable except for positive findings mentioned above in the HPI.   MEDICATIONS AT HOME:   Prior to Admission medications   Medication Sig Start Date End Date Taking? Authorizing Provider  acetaminophen (TYLENOL) 500 MG tablet Take 1,000 mg every 6 (six) hours as needed by mouth for moderate pain or headache.     [provider]  Cal Carb-Mag Hydrox-Simeth (ANTACID MULTI-SYMPTOM) [phone] MG CHEW Chew 2 tablets as needed by mouth (for acid reflux).    [provider]  Cholecalciferol (VITAMIN D3) 5000 units CAPS Take 5,000 Units daily by mouth.     [provider]  clindamycin (CLEOCIN) 150 MG capsule Take 600 mg See admin instructions by mouth. Take 600 mg by mouth 1 hour prior to dental appointment    [provider]  HYDROcodone-acetaminophen (NORCO) 5-325 MG tablet Take 1-2 tablets by mouth every 4 (four) hours as needed for moderate pain. 04/05/17   Hooten, Laurice Record, MD  naproxen sodium (ALEVE) 220 MG tablet Take 220 mg 2 (two) times daily as needed by mouth (for pain or headache).    [provider]  Omega-3 Fatty Acids (FISH OIL) 1000 MG CAPS Take 2,000 mg daily by mouth.    [provider]  Tetrahydrozoline HCl (VISINE OP) Place 1-2 drops as needed into both eyes (for dry eyes).    [provider]  vitamin E 400 UNIT capsule Take 400 Units daily by mouth.     [provider]      VITAL SIGNS:  Blood pressure (!)  158/90, pulse 89, temperature 98.3 F (36.8 C), temperature source Oral, resp. rate 18, height '5\' 2"'$  (1.575 m), weight 81.2 kg, SpO2 94 %.  PHYSICAL EXAMINATION:  Physical Exam  GENERAL:  67 y.o.-year-old Caucasian female patient lying in the bed with no acute distress.  EYES: Pupils equal, round, reactive to light and accommodation. No scleral icterus. Extraocular muscles intact.  HEENT: Head atraumatic, normocephalic. Oropharynx and nasopharynx clear.  NECK:  Supple, no jugular venous distention. No thyroid enlargement, no tenderness.  LUNGS: Normal breath sounds bilaterally, no wheezing, rales,rhonchi or crepitation. No use of accessory muscles of respiration.  CARDIOVASCULAR: Regular rate and rhythm, S1, S2 normal. No murmurs, rubs, or gallops.  ABDOMEN: Soft, nondistended, nontender. Bowel sounds present. No organomegaly or mass.  EXTREMITIES: No pedal edema, cyanosis, or clubbing.  NEUROLOGIC: Cranial nerves II through XII are intact. Muscle strength 5/5 in all extremities. Sensation  intact. Gait not checked.  PSYCHIATRIC: The patient is alert and oriented x 3.  Normal affect and good eye contact. SKIN: No obvious rash, lesion, or ulcer.   LABORATORY PANEL:   CBC Recent Labs  Lab 07/06/22 1549  WBC 6.9  HGB 14.1  HCT 41.9  PLT 303   ------------------------------------------------------------------------------------------------------------------  Chemistries  Recent Labs  Lab 07/06/22 1549  NA 138  K 4.0  CL 104  CO2 25  GLUCOSE 221*  BUN 11  CREATININE 0.50  CALCIUM 8.9   ------------------------------------------------------------------------------------------------------------------  Cardiac Enzymes No results for input(s): "TROPONINI" in the last 168 hours. ------------------------------------------------------------------------------------------------------------------  RADIOLOGY:  DG Chest 2 View  Result Date: 07/06/2022 CLINICAL DATA:  Chest pain. EXAM:  CHEST - 2 VIEW COMPARISON:  November 05, 2008. FINDINGS: The heart size and mediastinal contours are within normal limits. Right lung is clear. Stable probable left lingular scarring is noted. No acute abnormality is noted. The visualized skeletal structures are unremarkable. IMPRESSION: No active cardiopulmonary disease. Electronically Signed   By: Marijo Conception M.D.   On: 07/06/2022 16:10      IMPRESSION AND PLAN:  Assessment and Plan: * Chest pain - This is associated with abnormal EKG.  We will need to rule out acute coronary syndrome. - The patient will be admitted to an observation cardiac telemetry bed. - Will follow serial troponins and EKGs. - The patient will be placed on aspirin as well as p.r.n. sublingual nitroglycerin and morphine sulfate for pain. - We will check fasting lipids in AM. - Will add D-dimer given associated sinus tachycardia. - We will obtain a cardiology consult in a.m. for further cardiac risk stratification. - I notified Dr. Radford Pax about the patient   Elevated random blood glucose level - We will check hemoglobin A1c and place her on supplemental coverage with NovoLog. - This could be a new onset diabetes mellitus likely type II.    DVT prophylaxis: Lovenox.  Advanced Care Planning:  Code Status: full code.  Family Communication:  The plan of care was discussed in details with the patient (and family). I answered all questions. The patient agreed to proceed with the above mentioned plan. Further management will depend upon hospital course. Disposition Plan: Back to previous home environment Consults called: Cardiology. All the records are reviewed and case discussed with ED provider.  Status is: Observation  I certify that at the time of admission, it is my clinical judgment that the patient will require hospital care extending less than 2 midnights.                            Dispo: The patient is from: Home              Anticipated d/c is to: Home               Patient currently is not medically stable to d/c.              Difficult to place patient: No  Christel Mormon M.D on 07/06/2022 at 8:47 PM  [facility]   From 7 PM-7 AM, contact night-coverage [email or web address]  CC: Primary care physician; [facility]

## 2022-07-06 NOTE — ED Notes (Signed)
ED Provider at bedside. 

## 2022-07-06 NOTE — ED Provider Notes (Signed)
[facility] Provider Note    Event Date/Time   First MD Initiated Contact with Patient 07/06/22 1749     (approximate)   History   Chest Pain   HPI  Jillian Nelson is a 67 y.o. female with a history of arthritis and anxiety who presents with chest pain, acute onset this morning.  The pain is sharp and located in the left upper part of her chest.  It also somewhat radiate.  To the left upper abdomen under her ribs.  It is not positional or changed with movement of the arm.  She denies any associated shortness of breath, lightheadedness, nausea, or weakness.  She denies any prior history of this pain.  She has no injury to the chest.  The patient denies any associated leg swelling.  I reviewed the past medical records.  The patient's most recent outpatient counter in our system was a visit with Dr. Marry Guan from orthopedics in October of last year for follow-up of a prior knee arthroplasty.   Physical Exam   Triage Vital Signs: ED Triage Vitals  Enc Vitals Group     BP 07/06/22 1547 (!) 164/92     Pulse Rate 07/06/22 1547 (!) 103     Resp 07/06/22 1547 16     Temp 07/06/22 1547 98.3 F (36.8 C)     Temp Source 07/06/22 1547 Oral     SpO2 07/06/22 1547 94 %     Weight 07/06/22 1546 179 lb (81.2 kg)     Height 07/06/22 1546 '5\' 2"'$  (1.575 m)     Head Circumference --      Peak Flow --      Pain Score 07/06/22 1546 2     Pain Loc --      Pain Edu? --      Excl. in [place]? --     Most recent vital signs: Vitals:   07/06/22 1900 07/06/22 2000  BP: (!) 144/72 (!) 158/90  Pulse: 86 89  Resp: 17 18  Temp:    SpO2: 96% 94%     General: Awake, no distress.  CV:  Good peripheral perfusion.  Normal heart sounds. Resp:  Normal effort.  Lungs CTAB. Abd:  Soft and nontender.  No distention.  Other:  No calf or popliteal swelling or tenderness.   ED Results / Procedures / Treatments   Labs (all labs ordered are listed, but only abnormal results are  displayed) Labs Reviewed  BASIC METABOLIC PANEL - Abnormal; Notable for the following components:      Result Value   Glucose, Bld 221 (*)    All other components within normal limits  CBC  TROPONIN I (HIGH SENSITIVITY)  TROPONIN I (HIGH SENSITIVITY)     EKG  ED ECG REPORT I, Arta Silence, the attending physician, personally viewed and interpreted this ECG.  Date: 07/06/2022 EKG Time: 1545 Rate: 106 Rhythm: Sinus tachycardia QRS Axis: Left axis Intervals: normal ST/T Wave abnormalities: LVH with repolarization abnormality Narrative Interpretation: no evidence of acute ischemia; there are nonspecific abnormalities with no prior EKG available for comparison    RADIOLOGY  Chest x-ray: I independently viewed and interpreted the images; there is no focal consolidation or edema   PROCEDURES:  Critical Care performed: No  Procedures   MEDICATIONS ORDERED IN ED: Medications - No data to display   IMPRESSION / MDM / Epps / ED COURSE  I reviewed the triage vital signs and the nursing notes.  67 year old female with PMH as noted above presents with atypical chest pain since this morning which is now improving and is not associated with any significant symptoms.  Physical exam is unremarkable.  EKG shows some repolarization abnormality but no ischemic findings.  Chest x-ray is clear.  Initial troponin is negative.  BMP and CBC are within normal limits except for mild hyperglycemia.  Differential diagnosis includes, but is not limited to, musculoskeletal pain, nerve pain, GERD, gastritis, other GI etiology.  I have a low suspicion for ACS.  There is no clinical evidence for DVT or PE, or for aortic dissection or other vascular cause.  We will obtain a repeat EKG and troponin and reassess.  Patient's presentation is most consistent with acute presentation with potential threat to life or bodily function.  ---------------------------------------- 8:11 PM on  07/06/2022 -----------------------------------------  Repeat troponin is negative.  However the repeat EKG with a normal rate showing inferior T wave inversions and ST depressions.  There is no prior EKG available for comparison.  I consulted Dr. Radford Pax from cardiology who advises that these findings are concerning and recommends admitting the patient for ACS rule out.  She does not recommend starting anticoagulation but would recommend a repeat troponin in another several hours.  I then consulted Dr. Sidney Ace from the hospitalist service; based on her discussion he agrees to admit the patient.   FINAL CLINICAL IMPRESSION(S) / ED DIAGNOSES   Final diagnoses:  Atypical chest pain  Abnormal EKG     Rx / DC Orders   ED Discharge Orders     None        Note:  This document was prepared using Dragon voice recognition software and may include unintentional dictation errors.    Arta Silence, MD 07/06/22 2013

## 2022-07-06 NOTE — Assessment & Plan Note (Signed)
-   We will check hemoglobin A1c and place her on supplemental coverage with NovoLog. - This could be a new onset diabetes mellitus likely type II.

## 2022-07-06 NOTE — Assessment & Plan Note (Addendum)
-   This is associated with abnormal EKG.  Though it is likely atypical we will need to rule out acute coronary syndrome. - The patient will be admitted to an observation cardiac telemetry bed. - Will follow serial troponins and EKGs. - The patient will be placed on aspirin as well as p.r.n. sublingual nitroglycerin and morphine sulfate for pain. - We will check fasting lipids in AM. - Will add D-dimer given associated sinus tachycardia. - We will obtain a cardiology consult in a.m. for further cardiac risk stratification. - I notified Dr. Radford Pax about the patient

## 2022-07-06 NOTE — ED Triage Notes (Signed)
Pt reports CP that started around 830 today. Pain under left breast that was sharp at first. Denies SOB, n/v.

## 2022-07-07 ENCOUNTER — Observation Stay (HOSPITAL_BASED_OUTPATIENT_CLINIC_OR_DEPARTMENT_OTHER): Payer: BC Managed Care – PPO

## 2022-07-07 ENCOUNTER — Other Ambulatory Visit: Payer: Self-pay

## 2022-07-07 ENCOUNTER — Observation Stay (HOSPITAL_BASED_OUTPATIENT_CLINIC_OR_DEPARTMENT_OTHER)
Admit: 2022-07-07 | Discharge: 2022-07-07 | Disposition: A | Discharge status: Home / Self Care | Payer: BC Managed Care – PPO | Attending: Internal Medicine | Admitting: Internal Medicine

## 2022-07-07 DIAGNOSIS — R0609 Other forms of dyspnea: Secondary | ICD-10-CM | POA: Diagnosis not present

## 2022-07-07 DIAGNOSIS — R079 Chest pain, unspecified: Secondary | ICD-10-CM

## 2022-07-07 DIAGNOSIS — R0789 Other chest pain: Secondary | ICD-10-CM | POA: Diagnosis not present

## 2022-07-07 DIAGNOSIS — K219 Gastro-esophageal reflux disease without esophagitis: Secondary | ICD-10-CM

## 2022-07-07 DIAGNOSIS — R1319 Other dysphagia: Secondary | ICD-10-CM

## 2022-07-07 DIAGNOSIS — E669 Obesity, unspecified: Secondary | ICD-10-CM

## 2022-07-07 LAB — NM MYOCAR MULTI W/SPECT W/WALL MOTION / EF
Base ST Depression (mm): 1 mm
LV dias vol: 77 mL (ref 46–106)
LV sys vol: 33 mL
Nuc Stress EF: 57 %
Peak HR: 122 {beats}/min
Percent HR: 78 %
Rest HR: 83 {beats}/min
Rest Nuclear Isotope Dose: 9.7 mCi
SDS: 0
SRS: 3
SSS: 0
ST Depression (mm): 0 mm
Stress Nuclear Isotope Dose: 30.5 mCi
TID: 1.14

## 2022-07-07 LAB — ECHOCARDIOGRAM COMPLETE
AR max vel: 2.02 cm2
AV Area VTI: 1.96 cm2
AV Area mean vel: 2 cm2
AV Mean grad: 5 mmHg
AV Peak grad: 11 mmHg
Ao pk vel: 1.66 m/s
Area-P 1/2: 4.96 cm2
Height: 62 in
MV VTI: 2.5 cm2
S' Lateral: 3.3 cm
Weight: 2864 oz

## 2022-07-07 LAB — HEMOGLOBIN A1C
Hgb A1c MFr Bld: 6.9 % — ABNORMAL HIGH (ref 4.8–5.6)
Mean Plasma Glucose: 151 mg/dL

## 2022-07-07 LAB — HIV ANTIBODY (ROUTINE TESTING W REFLEX): HIV Screen 4th Generation wRfx: NONREACTIVE

## 2022-07-07 LAB — CBG MONITORING, ED: Glucose-Capillary: 115 mg/dL — ABNORMAL HIGH (ref 70–99)

## 2022-07-07 MED ORDER — TECHNETIUM TC 99M TETROFOSMIN IV KIT
30.4600 | PACK | Freq: Once | INTRAVENOUS | Status: AC | PRN
  Administered 2022-07-07: 30.46 via INTRAVENOUS

## 2022-07-07 MED ORDER — OMEPRAZOLE MAGNESIUM 20 MG PO TBEC: 20.0000 mg | DELAYED_RELEASE_TABLET | Freq: Every day | ORAL | 1 refills | Status: AC

## 2022-07-07 MED ORDER — TECHNETIUM TC 99M TETROFOSMIN IV KIT
9.6600 | PACK | Freq: Once | INTRAVENOUS | Status: AC | PRN
  Administered 2022-07-07: 9.66 via INTRAVENOUS

## 2022-07-07 MED ORDER — REGADENOSON 0.4 MG/5ML IV SOLN
0.4000 mg | Freq: Once | INTRAVENOUS | Status: AC
  Administered 2022-07-07: 0.4 mg via INTRAVENOUS

## 2022-07-07 NOTE — ED Notes (Signed)
Patient ambulated to bathroom w steady gait.

## 2022-07-07 NOTE — Progress Notes (Signed)
*  PRELIMINARY RESULTS* Echocardiogram 2D Echocardiogram has been performed.  Jillian Nelson 07/07/2022, 9:57 AM

## 2022-07-07 NOTE — Discharge Summary (Signed)
Physician Discharge Summary   Patient: Jillian Nelson MRN: [id number] DOB: [birth year]  Admit date:     07/06/2022  Discharge date: 07/07/22  Discharge Physician: Fritzi Mandes   PCP: [facility]   Recommendations at discharge:    PCP in 1-2 weeks Dr Siri Cole cardiology if needed  Discharge Diagnoses: Principal Problem:   Chest pain Active Problems:   Elevated random blood glucose level  Jillian Nelson is a 67 y.o. female with medical history significant for anxiety, prediabetes and osteoarthritis as well as GERD, who presented to the emergency room with acute onset of left parasternal and inframammary chest pain that started this morning.  She describes it sharp in nature and graded 6/10 in severity without radiation. Pt currently CP free. Has symptoms of GERD.  Chest pain--appears atypical -- patient is chest pain free. Troponin flat. EKG no acute major changes. --seen by [facility] cardiology. Myoview stress test negative. Follow-up as needed  Jerrye Bushy -- start patient on a omeprazole -- PRN Pepcid AC  Obesity -- diet and exercise discussed  Overall patient is hemodynamically stable. Will discharge to home with outpatient follow-up PCP        Consultants: Cardiology --[facility] Procedures performed: Myoview stress test  Disposition: Home Diet recommendation:  Discharge Diet Orders (From admission, onward)     Start     Ordered   07/07/22 0000  Diet - low sodium heart healthy        07/07/22 1440           Cardiac diet DISCHARGE MEDICATION: Allergies as of 07/07/2022       Reactions   Penicillins Anaphylaxis, Other (See Comments)   Has patient had a PCN reaction causing immediate rash, facial/tongue/throat swelling, SOB or lightheadedness with hypotension: Yes Has patient had a PCN reaction causing severe rash involving mucus membranes or skin necrosis: No Has patient had a PCN reaction that required hospitalization: No Has patient had a PCN reaction  occurring within the last 10 years: No If all of the above answers are "NO", then may proceed with Cephalosporin use.   Sulfa Antibiotics Anaphylaxis, Itching, Swelling   Latex Rash   LATEX IgE NEGATIVE ON 12-09-16   Oxycodone Rash        Medication List     STOP taking these medications    clindamycin 150 MG capsule Commonly known as: CLEOCIN   Fish Oil 1000 MG Caps   HYDROcodone-acetaminophen 5-325 MG tablet Commonly known as: Norco       TAKE these medications    acetaminophen 500 MG tablet Commonly known as: TYLENOL Take 1,000 mg every 6 (six) hours as needed by mouth for moderate pain or headache.   albuterol 108 (90 Base) MCG/ACT inhaler Commonly known as: VENTOLIN HFA Inhale 1 puff into the lungs every 4 (four) hours as needed.   Antacid Multi-Symptom 675-135-60 MG Chew Generic drug: Cal Carb-Mag Hydrox-Simeth Chew 2 tablets as needed by mouth (for acid reflux).   multivitamin with minerals tablet Take 1 tablet by mouth daily.   naproxen sodium 220 MG tablet Commonly known as: ALEVE Take 220 mg 2 (two) times daily as needed by mouth (for pain or headache).   omeprazole 20 MG tablet Commonly known as: PriLOSEC OTC Take 1 tablet (20 mg total) by mouth daily.   VISINE OP Place 1-2 drops as needed into both eyes (for dry eyes).   Vitamin D3 125 MCG (5000 UT) capsule Generic drug: Cholecalciferol Take 5,000 Units daily by  mouth.   vitamin E 180 MG (400 UNITS) capsule Take 400 Units daily by mouth.        Follow-up [place]. Schedule an appointment as soon as possible for a visit in 1 week(s).   Specialty: General Practice Why: hospital f/u Contact information: [address] [place] [address] [phone]         Nelva Bush, MD. Go to.   Specialty: Cardiology Why: As needed Contact information: [address] [place] [address] [phone]                  [place] [employer]   07/06/22 1546  Weight: 81.2 kg     Condition at discharge: good  The results of significant diagnostics from this hospitalization (including imaging, microbiology, ancillary and laboratory) are listed below for reference.   Imaging Studies: NM Myocar Multi W/Spect W/Wall Motion / EF  Result Date: 07/07/2022   Normal pharmacologic myocardial perfusion stress test without significant ischemia or scar.   Normal left ventricular systolic function (LVEF 0000000).   There is no significant coronary artery calcification.  Aortic atheroscerlosis and hepatic steatosis are incidentally noted on the attenuation correction CT.   This is a low risk study.   ECHOCARDIOGRAM COMPLETE  Result Date: 07/07/2022    ECHOCARDIOGRAM REPORT   Patient Name:   Jillian Nelson Date of Exam: 07/07/2022 Medical Rec #:  [id number]      Height:       62.0 in Accession #:    [id number]     Weight:       179.0 lb Date of Birth:  May 10, 1956       BSA:          1.824 m Patient Age:    73 years       BP:           105/62 mmHg Patient Gender: F              HR:           79 bpm. Exam Location:  [facility] Procedure: 2D Echo, Cardiac Doppler and Color Doppler Indications:     Chest Pain  History:         Patient has no prior history of Echocardiogram examinations.                  Signs/Symptoms:Chest Pain.  Sonographer:     Wenda Low Referring Phys:  [phone] CHRISTOPHER END Diagnosing Phys: Nelva Bush MD IMPRESSIONS  1. Left ventricular ejection fraction, by estimation, is 55 to 60%. The left ventricle has normal function. The left ventricle has no regional wall motion abnormalities. Left ventricular diastolic parameters were normal.  2. Right ventricular systolic function is normal. The right ventricular size is normal.  3. The mitral valve is normal in structure. Trivial mitral valve regurgitation. No evidence of mitral stenosis.  4. The aortic valve is tricuspid. Aortic valve regurgitation is not visualized. No  aortic stenosis is present. FINDINGS  Left Ventricle: Left ventricular ejection fraction, by estimation, is 55 to 60%. The left ventricle has normal function. The left ventricle has no regional wall motion abnormalities. The left ventricular internal cavity size was normal in size. There is  no left ventricular hypertrophy. Left ventricular diastolic parameters were normal. Right Ventricle: The right ventricular size is normal. No increase in right ventricular wall thickness. Right ventricular systolic function is normal. Left Atrium: Left atrial size  was normal in size. Right Atrium: Right atrial size was normal in size. Pericardium: There is no evidence of pericardial effusion. Mitral Valve: The mitral valve is normal in structure. Trivial mitral valve regurgitation. No evidence of mitral valve stenosis. MV peak gradient, 6.2 mmHg. The mean mitral valve gradient is 3.0 mmHg. Tricuspid Valve: The tricuspid valve is not well visualized. Tricuspid valve regurgitation is trivial. Aortic Valve: The aortic valve is tricuspid. Aortic valve regurgitation is not visualized. No aortic stenosis is present. Aortic valve mean gradient measures 5.0 mmHg. Aortic valve peak gradient measures 11.0 mmHg. Aortic valve area, by VTI measures 1.96  cm. Pulmonic Valve: The pulmonic valve was not well visualized. Pulmonic valve regurgitation is trivial. No evidence of pulmonic stenosis. Aorta: The aortic root is normal in size and structure. Pulmonary Artery: The pulmonary artery is of normal size. IAS/Shunts: The interatrial septum was not well visualized.  LEFT VENTRICLE PLAX 2D LVIDd:         4.60 cm   Diastology LVIDs:         3.30 cm   LV e' medial:    10.90 cm/s LV PW:         0.99 cm   LV E/e' medial:  8.4 LV IVS:        0.72 cm   LV e' lateral:   11.70 cm/s LVOT diam:     1.90 cm   LV E/e' lateral: 7.8 LV SV:         67 LV SV Index:   37 LVOT Area:     2.84 cm  RIGHT VENTRICLE RV Basal diam:  2.60 cm RV Mid diam:    2.20 cm RV  S prime:     18.50 cm/s TAPSE (M-mode): 2.3 cm LEFT ATRIUM             Index        RIGHT ATRIUM           Index LA diam:        3.40 cm 1.86 cm/m   RA Area:     13.10 cm LA Vol (A2C):   37.0 ml 20.29 ml/m  RA Volume:   30.30 ml  16.61 ml/m LA Vol (A4C):   40.3 ml 22.10 ml/m LA Biplane Vol: 38.9 ml 21.33 ml/m  AORTIC VALVE                     PULMONIC VALVE AV Area (Vmax):    2.02 cm      PV Vmax:       0.91 m/s AV Area (Vmean):   2.00 cm      PV Peak grad:  3.3 mmHg AV Area (VTI):     1.96 cm AV Vmax:           166.00 cm/s AV Vmean:          105.000 cm/s AV VTI:            0.342 m AV Peak Grad:      11.0 mmHg AV Mean Grad:      5.0 mmHg LVOT Vmax:         118.00 cm/s LVOT Vmean:        74.000 cm/s LVOT VTI:          0.236 m LVOT/AV VTI ratio: 0.69  AORTA Ao Root diam: 2.90 cm MITRAL VALVE MV Area (PHT): 4.96 cm     SHUNTS MV Area VTI:   2.50 cm  Systemic VTI:  0.24 m MV Peak grad:  6.2 mmHg     Systemic Diam: 1.90 cm MV Mean grad:  3.0 mmHg MV Vmax:       1.24 m/s MV Vmean:      81.3 cm/s MV Decel Time: 153 msec MV E velocity: 91.20 cm/s MV A velocity: 127.00 cm/s MV E/A ratio:  0.72 Nelva Bush MD Electronically signed by Nelva Bush MD Signature Date/Time: 07/07/2022/10:54:12 AM    Final    DG Chest 2 View  Result Date: 07/06/2022 CLINICAL DATA:  Chest pain. EXAM: CHEST - 2 VIEW COMPARISON:  November 05, 2008. FINDINGS: The heart size and mediastinal contours are within normal limits. Right lung is clear. Stable probable left lingular scarring is noted. No acute abnormality is noted. The visualized skeletal structures are unremarkable. IMPRESSION: No active cardiopulmonary disease. Electronically Signed   By: Marijo Conception M.D.   On: 07/06/2022 16:10    Microbiology: Results for orders placed or performed during the hospital encounter of 12/09/16  Surgical pcr screen     Status: None   Collection Time: 12/09/16  8:34 AM   Specimen: Nasal Mucosa; Nasal Swab  Result Value Ref Range  Status   MRSA, PCR NEGATIVE NEGATIVE Final   Staphylococcus aureus NEGATIVE NEGATIVE Final    Comment:        The Xpert SA Assay (FDA approved for NASAL specimens in patients over 73 years of age), is one component of a comprehensive surveillance program.  Test performance has been validated by [facility] for patients greater than or equal to 11 year old. It is not intended to diagnose infection nor to guide or monitor treatment.   Urine culture     Status: None   Collection Time: 12/09/16  8:34 AM   Specimen: Urine, Random  Result Value Ref Range Status   Specimen Description URINE, RANDOM  Final   Special Requests Normal  Final   Culture   Final    NO GROWTH Performed at [facility], [address] [place], [place] [address]    Report Status 12/10/2016 FINAL  Final    Labs: CBC: Recent Labs  Lab 07/06/22 1549  WBC 6.9  HGB 14.1  HCT 41.9  MCV 94.4  PLT [id number]   Basic Metabolic Panel: Recent Labs  Lab 07/06/22 1549  NA 138  K 4.0  CL 104  CO2 25  GLUCOSE 221*  BUN 11  CREATININE 0.50  CALCIUM 8.9   Liver Function Tests: No results for input(s): "AST", "ALT", "ALKPHOS", "BILITOT", "PROT", "ALBUMIN" in the last 168 hours. CBG: Recent Labs  Lab 07/06/22 2131 07/07/22 0746  GLUCAP 104* 115*    Discharge time spent: greater than 30 minutes.  Signed: Fritzi Mandes, MD [facility] 07/07/2022

## 2022-07-07 NOTE — Consult Note (Signed)
Cardiology Consultation:   Patient ID: Jillian Nelson; MT:[id number]; 10/23/1955   Admit date: 07/06/2022 Date of Consult: 07/07/2022  Primary Care Provider: Center, [facility] Primary Cardiologist: new - consult by End Primary Electrophysiologist:  None   Patient Profile:   Jillian Nelson is a 67 y.o. female with a hx of prediabetes, osteoarthritis, GERD, and anxiety who is being seen today for the evaluation of chest pain at the request of Dr. Sidney Ace.  History of Present Illness:   [name] has no previously known cardiac history.  She reports a several month history of exertional dyspnea dating back to a viral URI in 04/2022.  Otherwise, she has been in her usual state of health.  Shortly after waking up yesterday, the patient developed left lateral and lower chest discomfort that was sharp and would last for a few seconds in duration followed by spontaneous resolution.  She had multiple episodes of this discomfort that all seemed to have lasted several seconds.  Pain did not radiate.  There were no associated symptoms.  Pain was not exacerbated by exertion.  Due to intermittent symptoms, she presented to [facility] ED where she was initially found to have mildly elevated BP in the 160s over 90s that has subsequently improved.  She was mildly tachycardic with rates in the low 100s that has improved to the 70s bpm.  Afebrile.  Oxygen saturation 95% on room air.  EKG showed sinus tachycardia with a rare PVC with LVH and inferior ST depression.  High-sensitivity troponin negative x 2.  D-dimer negative.  CBC unremarkable.  Glucose 221.  Potassium 4.0.  Renal function normal.  Chest x-ray without acute cardiopulmonary disease.  In the ED, she received aspirin 81 mg and lactated Ringer's.  She was admitted and cardiology was asked to evaluate.  At time of cardiology evaluation the patient is chest pain-free.  She also reports a history of dysphagia and reflux.    Past Medical History:   Diagnosis Date   Anxiety    Arthritis    Family history of adverse reaction to anesthesia    mom is hard to wake up after anesthesia.   GERD (gastroesophageal reflux disease)     Past Surgical History:  Procedure Laterality Date   BREAST BIOPSY Right    neg   BREAST SURGERY     right   CYST EXCISION Left 1996   arm   JOINT REPLACEMENT     KNEE ARTHROPLASTY Right 12/21/2016   Procedure: COMPUTER ASSISTED TOTAL KNEE ARTHROPLASTY;  Surgeon: Dereck Leep, MD;  Location: [facility] ORS;  Service: Orthopedics;  Laterality: Right;   KNEE CLOSED REDUCTION Right 04/05/2017   Procedure: CLOSED MANIPULATION KNEE;  Surgeon: Dereck Leep, MD;  Location: [facility] ORS;  Service: Orthopedics;  Laterality: Right;   PLANTAR FASCIA SURGERY Left 2002   Right Knee replacement Right 12/21/2016     Home Meds: Prior to Admission medications   Medication Sig Start Date End Date Taking? Authorizing Provider  albuterol (VENTOLIN HFA) 108 (90 Base) MCG/ACT inhaler Inhale 1 puff into the lungs every 4 (four) hours as needed. 05/01/22  Yes [provider]  acetaminophen (TYLENOL) 500 MG tablet Take 1,000 mg every 6 (six) hours as needed by mouth for moderate pain or headache.     [provider]  Cal Carb-Mag Hydrox-Simeth (ANTACID MULTI-SYMPTOM) [phone] MG CHEW Chew 2 tablets as needed by mouth (for acid reflux).    [provider]  Cholecalciferol (VITAMIN D3) 5000  units CAPS Take 5,000 Units daily by mouth.     [provider]  clindamycin (CLEOCIN) 150 MG capsule Take 600 mg See admin instructions by mouth. Take 600 mg by mouth 1 hour prior to dental appointment    [provider]  HYDROcodone-acetaminophen (NORCO) 5-325 MG tablet Take 1-2 tablets by mouth every 4 (four) hours as needed for moderate pain. 04/05/17   Hooten, Laurice Record, MD  naproxen sodium (ALEVE) 220 MG tablet Take 220 mg 2 (two) times daily as needed by mouth (for pain or headache).    [provider]  Omega-3 Fatty Acids (FISH OIL) 1000 MG CAPS Take 2,000 mg daily by mouth.    [provider]  Tetrahydrozoline HCl (VISINE OP) Place 1-2 drops as needed into both eyes (for dry eyes).    [provider]  vitamin E 400 UNIT capsule Take 400 Units daily by mouth.     [provider]    Inpatient Medications: Scheduled Meds:  aspirin EC  81 mg Oral Daily   enoxaparin (LOVENOX) injection  40 mg Subcutaneous Q24H   insulin aspart  0-5 Units Subcutaneous QHS   insulin aspart  0-9 Units Subcutaneous TID WC   Continuous Infusions:  lactated ringers 100 mL/hr at 07/06/22 2144   PRN Meds: acetaminophen, ALPRAZolam, magnesium hydroxide, ondansetron (ZOFRAN) IV, traZODone  Allergies:   Allergies  Allergen Reactions   Penicillins Anaphylaxis and Other (See Comments)    Has patient had a PCN reaction causing immediate rash, facial/tongue/throat swelling, SOB or lightheadedness with hypotension: Yes Has patient had a PCN reaction causing severe rash involving mucus membranes or skin necrosis: No Has patient had a PCN reaction that required hospitalization: No Has patient had a PCN reaction occurring within the last 10 years: No If all of the above answers are "NO", then may proceed with Cephalosporin use.    Sulfa Antibiotics Anaphylaxis, Itching and Swelling   Latex Rash    LATEX IgE NEGATIVE ON 12-09-16   Oxycodone Rash    Social History:   Social History   Socioeconomic History   Marital status: Married    Spouse name: Not on file   Number of children: Not on file   Years of education: Not on file   Highest education level: Not on file  Occupational History   Not on file  Tobacco Use   Smoking status: Former    Types: Cigarettes    Quit date: 12/09/2004    Years since quitting: 17.5   Smokeless tobacco: Never  Substance and Sexual Activity   Alcohol use: No   Drug use: No   Sexual activity: Not on file  Other Topics Concern   Not on  file  Social History Narrative   Not on file   Social Determinants of Health   Financial Resource Strain: Not on file  Food Insecurity: Not on file  Transportation Needs: Not on file  Physical Activity: Not on file  Stress: Not on file  Social Connections: Not on file  Intimate Partner Violence: Not on file     Family History:   Family History  Problem Relation Age of Onset   Dementia Father    Breast cancer Neg Hx     ROS:  Review of Systems  Constitutional:  Negative for chills, diaphoresis, fever, malaise/fatigue and weight loss.  HENT:  Negative for congestion.   Eyes:  Negative for discharge and redness.  Respiratory:  Positive for shortness of breath. Negative for cough,  sputum production and wheezing.   Cardiovascular:  Positive for chest pain. Negative for palpitations, orthopnea, claudication, leg swelling and PND.  Gastrointestinal:  Negative for abdominal pain, heartburn, nausea and vomiting.  Genitourinary:  Negative for hematuria.  Musculoskeletal:  Negative for falls and myalgias.  Skin:  Negative for rash.  Neurological:  Negative for dizziness, tingling, tremors, sensory change, speech change, focal weakness, loss of consciousness and weakness.  Endo/Heme/Allergies:  Does not bruise/bleed easily.  Psychiatric/Behavioral:  Negative for substance abuse. The patient is not nervous/anxious.   All other systems reviewed and are negative.     Physical Exam/Data:   Vitals:   07/07/22 0530 07/07/22 0605 07/07/22 0619 07/07/22 0630  BP: 134/71 109/63  105/62  Pulse: 75 74  73  Resp: '14 14  13  '$ Temp:   97.9 F (36.6 C)   TempSrc:   Oral   SpO2: 95% 95%  96%  Weight:      Height:       No intake or output data in the 24 hours ending 07/07/22 0748 Filed Weights   07/06/22 1546  Weight: 81.2 kg   Body mass index is 32.74 kg/m.   Physical Exam: General: Well developed, well nourished, in no acute distress. Head: Normocephalic, atraumatic, sclera  non-icteric, no xanthomas, nares without discharge.  Neck: Negative for carotid bruits. JVD not elevated. Lungs: Clear bilaterally to auscultation without wheezes, rales, or rhonchi. Breathing is unlabored. Heart: RRR with S1 S2. No murmurs, rubs, or gallops appreciated. Abdomen: Soft, non-tender, non-distended with normoactive bowel sounds. No hepatomegaly. No rebound/guarding. No obvious abdominal masses. Msk:  Strength and tone appear normal for age. Extremities: No clubbing or cyanosis. No edema. Distal pedal pulses are 2+ and equal bilaterally. Neuro: Alert and oriented X 3. No facial asymmetry. No focal deficit. Moves all extremities spontaneously. Psych:  Responds to questions appropriately with a normal affect.   EKG:  The EKG was personally reviewed and demonstrates: NSR, 78 bpm, left axis deviation, LVH, poor R wave progression, inferior ST depression Telemetry:  Telemetry was personally reviewed and demonstrates: Sinus rhythm with artifact  Weights: Filed Weights   07/06/22 1546  Weight: 81.2 kg    Relevant CV Studies:  None available for review  Laboratory Data:  Chemistry Recent Labs  Lab 07/06/22 1549  NA 138  K 4.0  CL 104  CO2 25  GLUCOSE 221*  BUN 11  CREATININE 0.50  CALCIUM 8.9  GFRNONAA >60  ANIONGAP 9    No results for input(s): "PROT", "ALBUMIN", "AST", "ALT", "ALKPHOS", "BILITOT" in the last 168 hours. Hematology Recent Labs  Lab 07/06/22 1549  WBC 6.9  RBC 4.44  HGB 14.1  HCT 41.9  MCV 94.4  MCH 31.8  MCHC 33.7  RDW 12.4  PLT 303   Cardiac EnzymesNo results for input(s): "TROPONINI" in the last 168 hours. No results for input(s): "TROPIPOC" in the last 168 hours.  BNPNo results for input(s): "BNP", "PROBNP" in the last 168 hours.  DDimer  Recent Labs  Lab 07/06/22 1549  DDIMER 0.46    Radiology/Studies:  DG Chest 2 View  Result Date: 07/06/2022  IMPRESSION: No active cardiopulmonary disease. Electronically Signed   By: Marijo Conception M.D.   On: 07/06/2022 16:10    Assessment and Plan:   1.  Chest pain with moderate risk for cardiac etiology: -Currently chest pain-free -High-sensitivity troponin negative -Overall, symptoms of chest pain are atypical for angina given the last only seconds in duration followed  by spontaneous resolution.  However, patient does have some EKG changes concerning for possible ischemia versus LVH -No indication for heparin drip at this time -N.p.o. -Obtain echo -Lexiscan MPI later this morning -If the above studies are unrevealing, there will be no plans for further cardiac testing  2.  Dyspnea: -She reports some exertional shortness of breath dating back to December 2023, at which time she had a viral URI of uncertain etiology -Obtain echo and pursue Lexiscan MPI as outlined above  3.  Dysphagia with reflux: -Possibly contributing to presentation -Ongoing management per primary service    Shared Decision Making/Informed Consent{  The risks [chest pain, shortness of breath, cardiac arrhythmias, dizziness, blood pressure fluctuations, myocardial infarction, stroke/transient ischemic attack, nausea, vomiting, allergic reaction, radiation exposure, metallic taste sensation and life-threatening complications (estimated to be 1 in 10,000)], benefits (risk stratification, diagnosing coronary artery disease, treatment guidance) and alternatives of a nuclear stress test were discussed in detail with Ms. Gilkeson and she agrees to proceed.    For questions or updates, please contact [place] Please consult [email or web address] for contact info under Cardiology/STEMI.   Signed, Christell Faith, PA-C [facility] Pager: [phone] 07/07/2022, 7:48 AM

## 2022-07-07 NOTE — ED Notes (Signed)
Patient seen for Admission Assessment questions

## 2022-12-07 IMAGING — MG MM DIGITAL SCREENING BILAT W/ TOMO AND CAD
8 series · 8 of 24 positions shown · non-contrast
Comparison: Previous exam(s).

CLINICAL DATA: Screening.

EXAM:
DIGITAL SCREENING BILATERAL MAMMOGRAM WITH TOMOSYNTHESIS AND CAD
TECHNIQUE: Bilateral screening digital craniocaudal and mediolateral oblique
mammograms were obtained. Bilateral screening digital breast
tomosynthesis was performed. The images were evaluated with
computer-aided detection.

[L MLO synth-2D]
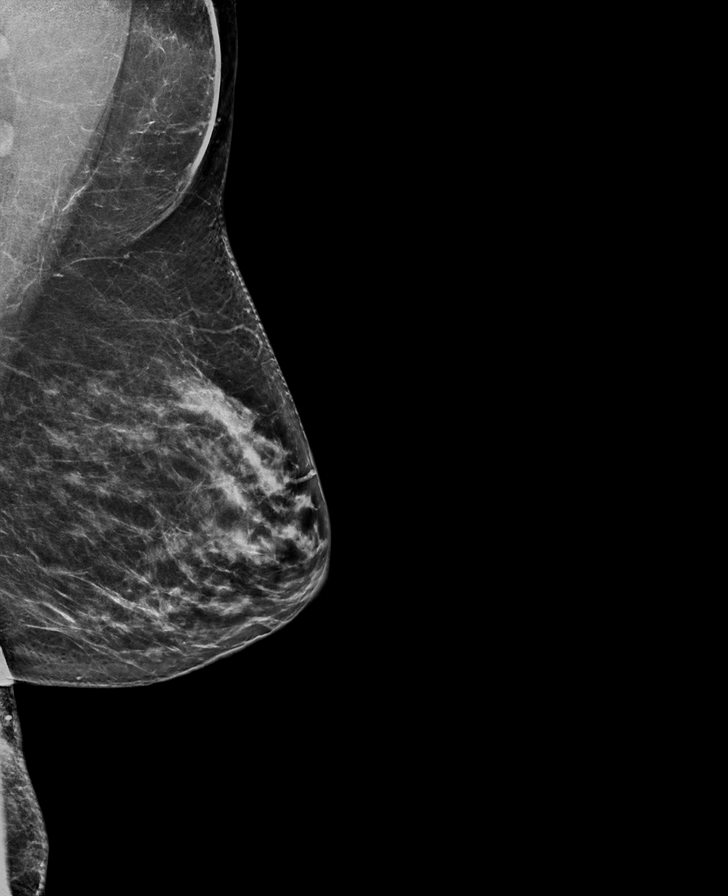

[R MLO synth-2D]
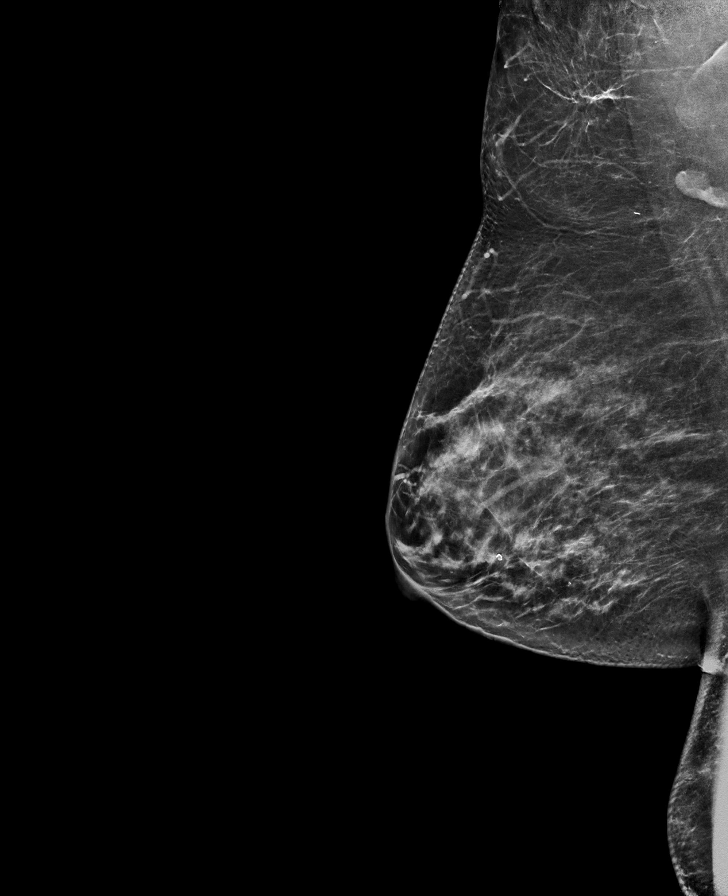

[R CC synth-2D]
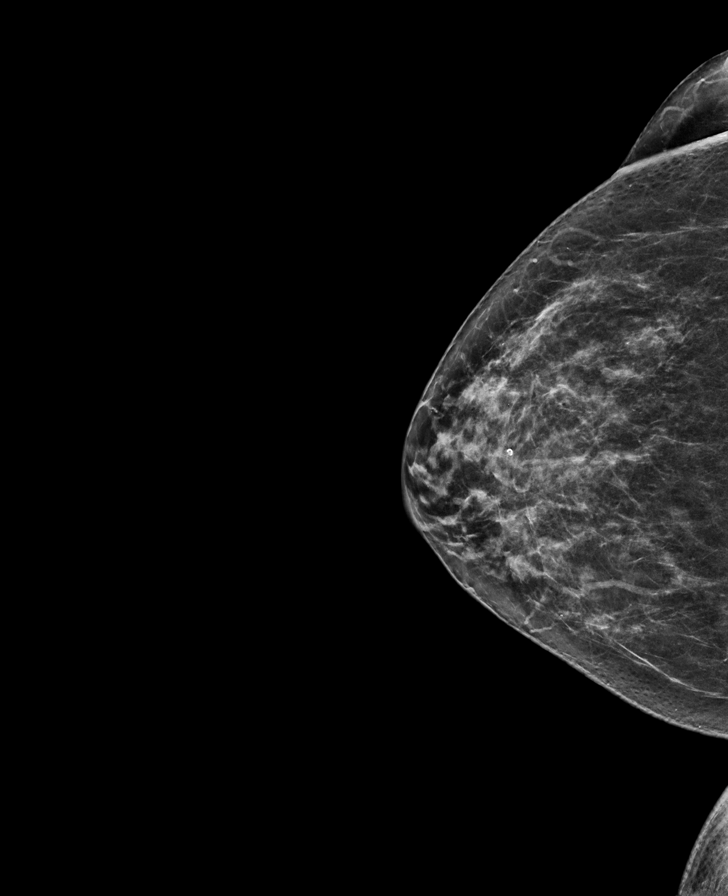

[L CC synth-2D]
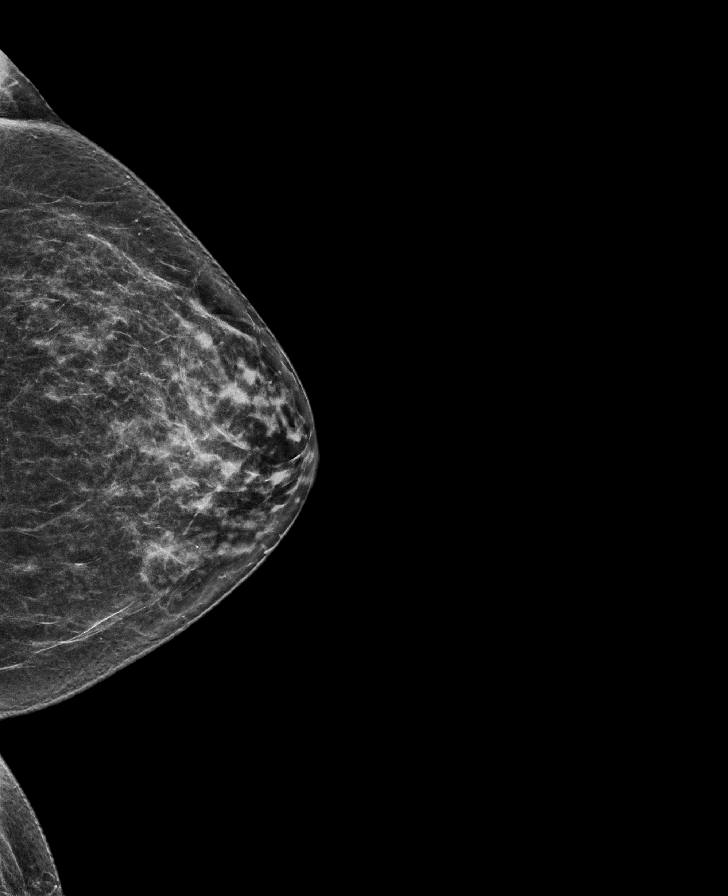

[L MLO tomo · tomo slice 39/76.0]
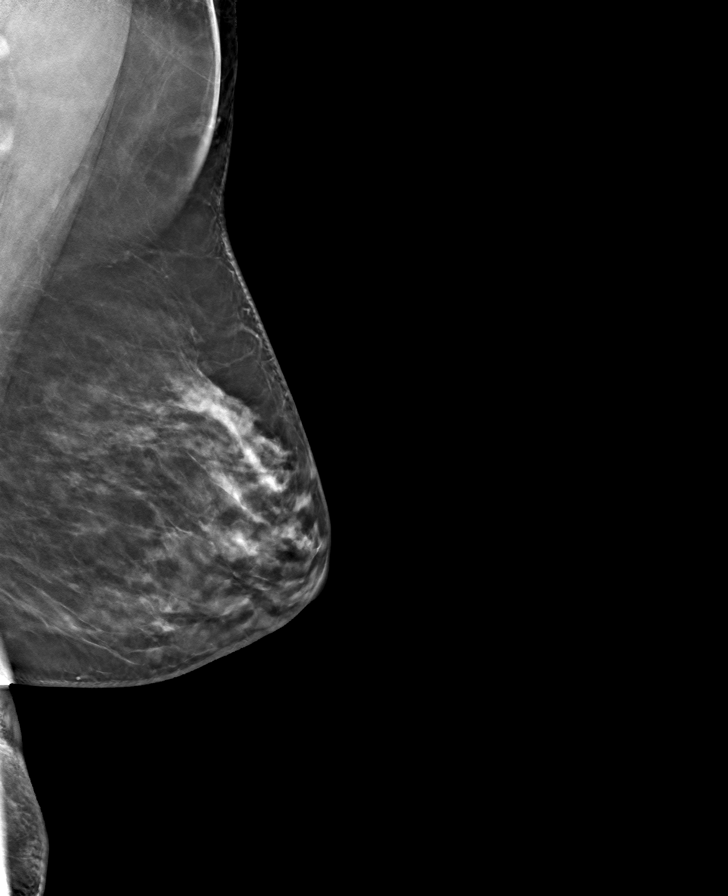

[R CC tomo · tomo slice 33/66.0]
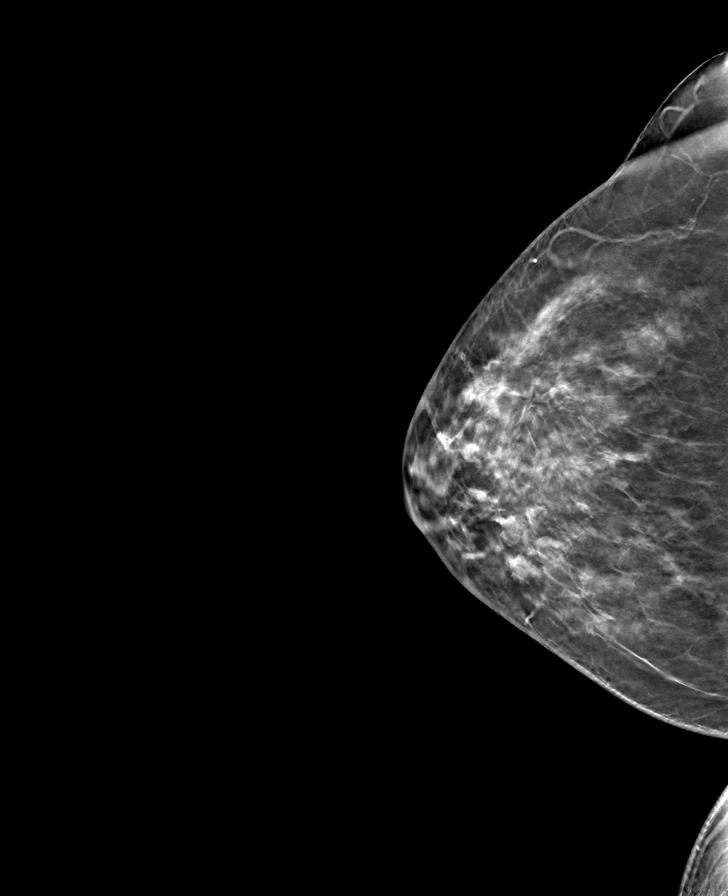

[L CC tomo · tomo slice 34/67.0]
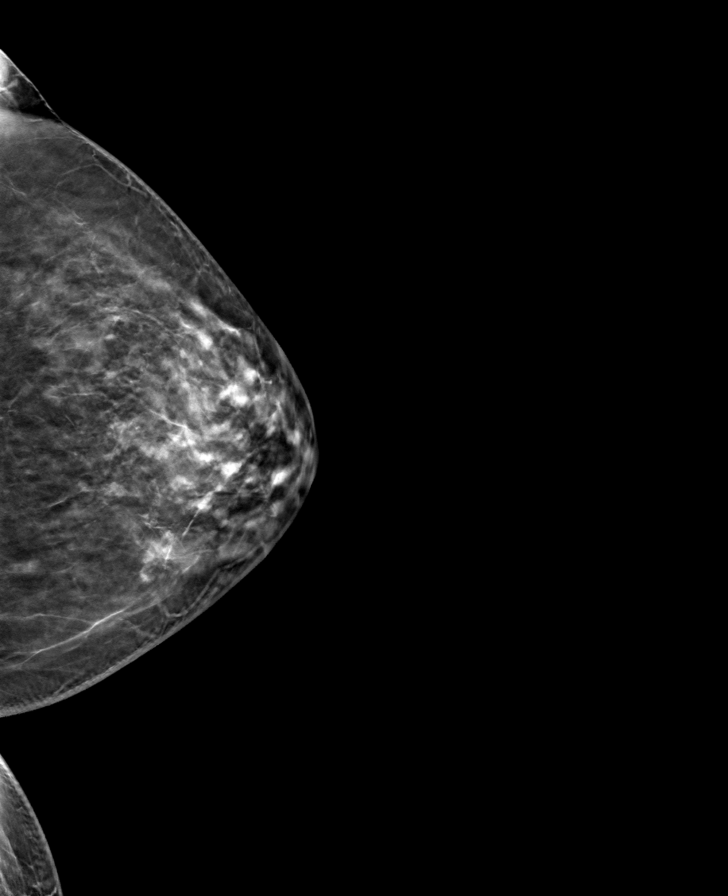

[R MLO tomo · tomo slice 37/74.0]
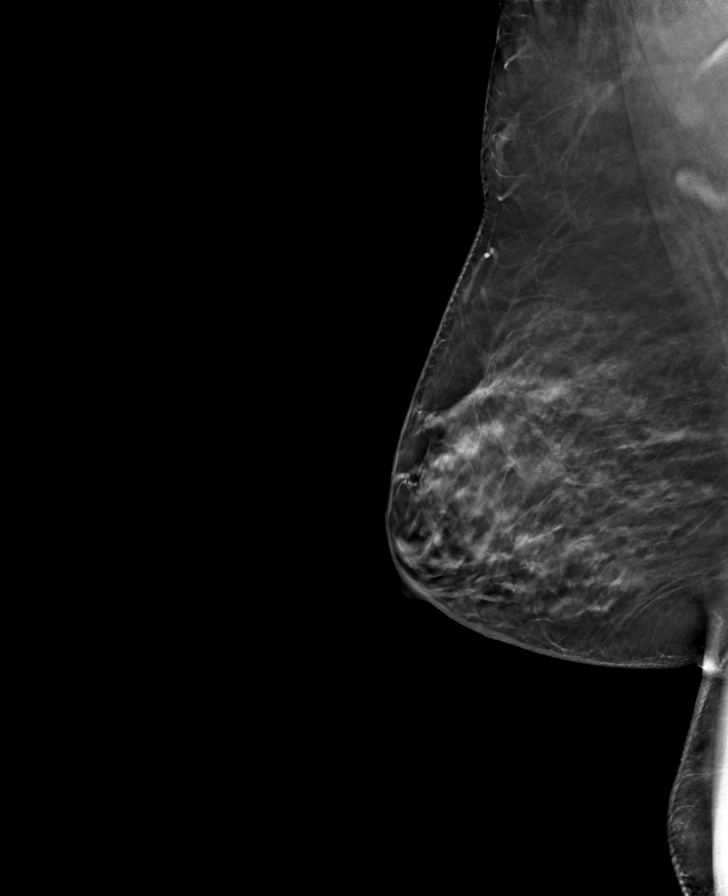

[8 of 24 positions shown; findings below may reference images not displayed]

ACR Breast Density Category c: The breast tissue is heterogeneously
dense, which may obscure small masses.
FINDINGS: There are no findings suspicious for malignancy.
IMPRESSION: No mammographic evidence of malignancy. A result letter of this
screening mammogram will be mailed directly to the patient.

RECOMMENDATION:
Screening mammogram in one year. (Code:Q3-W-BC3)

BI-RADS CATEGORY  1: Negative.
